# Patient Record
Sex: Male | Born: 1945 | Race: White | Hispanic: No | Marital: Married | Smoking: Never smoker
Health system: Southern US, Community
[De-identification: ages and names within clinical notes are randomized; demographics above are authoritative.]

## PROBLEM LIST (undated history)

## (undated) ENCOUNTER — Emergency Department: Payer: Medicare Other

## (undated) DIAGNOSIS — C801 Malignant (primary) neoplasm, unspecified: Secondary | ICD-10-CM

## (undated) HISTORY — DX: Malignant (primary) neoplasm, unspecified: C80.1

---

## 2013-01-18 DIAGNOSIS — C801 Malignant (primary) neoplasm, unspecified: Secondary | ICD-10-CM

## 2013-01-18 HISTORY — DX: Malignant (primary) neoplasm, unspecified: C80.1

## 2015-09-19 ENCOUNTER — Encounter: Payer: Self-pay | Admitting: *Deleted

## 2015-10-02 ENCOUNTER — Encounter: Payer: Self-pay | Admitting: General Surgery

## 2015-10-06 ENCOUNTER — Encounter: Payer: Self-pay | Admitting: General Surgery

## 2015-10-06 ENCOUNTER — Ambulatory Visit (INDEPENDENT_AMBULATORY_CARE_PROVIDER_SITE_OTHER): Payer: 59 | Admitting: General Surgery

## 2015-10-06 VITALS — BP 124/80 | HR 76 | Resp 12 | Ht 67.0 in | Wt 170.0 lb

## 2015-10-06 DIAGNOSIS — Z1211 Encounter for screening for malignant neoplasm of colon: Secondary | ICD-10-CM | POA: Insufficient documentation

## 2015-10-06 MED ORDER — POLYETHYLENE GLYCOL 3350 17 GM/SCOOP PO POWD: 1.0000 | Freq: Once | ORAL | 0 refills | Status: AC

## 2015-10-06 NOTE — Progress Notes (Signed)
Patient ID: Bruce Estrada., male   DOB: 1945-05-19, 70 y.o.   MRN: [id number]  Chief Complaint  Patient presents with  . Colonoscopy    HPI Bruce Estrada. is a 70 y.o. male here today for a evaluation of a screening colonoscopy.No GI problems at this time.No family history of colon cancer.Moves his bowels two times daily. HPI  Past Medical History:  Diagnosis Date  . 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 No pertinent surgical history.  History reviewed. No pertinent family history.  Social History Social History  Substance Use Topics  . Smoking status: Never Smoker  . Smokeless tobacco: Never Used  . Alcohol use No    No Known Allergies  Current Outpatient Prescriptions  Medication Sig Dispense Refill  . Multiple Vitamins-Minerals (MULTIVITAMIN WITH MINERALS) tablet Take 1 tablet by mouth daily.    . polyethylene glycol powder (GLYCOLAX/MIRALAX) powder Take 255 g by mouth once. 255 g 0   No current facility-administered medications for this visit.     Review of Systems Review of Systems  Constitutional: Negative.   Respiratory: Negative.   Cardiovascular: Negative.     Blood pressure 124/80, pulse 76, resp. rate 12, height 5\' 7"  (1.702 m), weight 170 lb (77.1 kg).  Physical Exam Physical Exam  Constitutional: He is oriented to person, place, and time. He appears well-developed and well-nourished.  Eyes: Conjunctivae are normal. No scleral icterus.  Neck: Neck supple.  Cardiovascular: Normal rate, regular rhythm and normal heart sounds.   Pulmonary/Chest: Effort normal and breath sounds normal.  Abdominal: Soft. Normal appearance and bowel sounds are normal. There is no tenderness.  Lymphadenopathy:    He has no cervical adenopathy.  Neurological: He is alert and oriented to person, place, and time.  Skin: Skin is warm and dry.      Assessment    Candidate for screening colonoscopy    Plan        Colonoscopy with possible  biopsy/polypectomy prn: Information regarding the procedure, including its potential risks and complications (including but not limited to perforation of the bowel, which may require emergency surgery to repair, and bleeding) was verbally given to the patient. Educational information regarding lower intestinal endoscopy was given to the patient. Written instructions for how to complete the bowel prep using Miralax were provided. The importance of drinking ample fluids to avoid dehydration as a result of the prep emphasized.  The patient is scheduled for a Colonoscopy at [facility] on 10/21/15. They are aware to call the day before to get their arrival time. Miralax prescription has been sent into the patient's pharmacy. The patient is aware of date and instructions.    This information has been scribed by Gaspar Cola CMA.   Robert Bellow 10/06/2015, 9:00 PM

## 2015-10-06 NOTE — Patient Instructions (Addendum)
Colonoscopy A colonoscopy is an exam to look at the entire large intestine (colon). This exam can help find problems such as tumors, polyps, inflammation, and areas of bleeding. The exam takes about 1 hour.  LET [facility] CARE PROVIDER KNOW ABOUT:   Any allergies you have.  All medicines you are taking, including vitamins, herbs, eye drops, creams, and over-the-counter medicines.  Previous problems you or members of your family have had with the use of anesthetics.  Any blood disorders you have.  Previous surgeries you have had.  Medical conditions you have. RISKS AND COMPLICATIONS  Generally, this is a safe procedure. However, as with any procedure, complications can occur. Possible complications include:  Bleeding.  Tearing or rupture of the colon wall.  Reaction to medicines given during the exam.  Infection (rare). BEFORE THE PROCEDURE   Ask your health care provider about changing or stopping your regular medicines.  You may be prescribed an oral bowel prep. This involves drinking a large amount of medicated liquid, starting the day before your procedure. The liquid will cause you to have multiple loose stools until your stool is almost clear or light green. This cleans out your colon in preparation for the procedure.  Do not eat or drink anything else once you have started the bowel prep, unless your health care provider tells you it is safe to do so.  Arrange for someone to drive you home after the procedure. PROCEDURE   You will be given medicine to help you relax (sedative).  You will lie on your side with your knees bent.  A long, flexible tube with a light and camera on the end (colonoscope) will be inserted through the rectum and into the colon. The camera sends video back to a computer screen as it moves through the colon. The colonoscope also releases carbon dioxide gas to inflate the colon. This helps your health care provider see the area better.  During  the exam, your health care provider may take a small tissue sample (biopsy) to be examined under a microscope if any abnormalities are found.  The exam is finished when the entire colon has been viewed. AFTER THE PROCEDURE   Do not drive for 24 hours after the exam.  You may have a small amount of blood in your stool.  You may pass moderate amounts of gas and have mild abdominal cramping or bloating. This is caused by the gas used to inflate your colon during the exam.  Ask when your test results will be ready and how you will get your results. Make sure you get your test results.   This information is not intended to replace advice given to you by your health care provider. Make sure you discuss any questions you have with your health care provider.   Document Released: 01/02/2000 Document Revised: 10/25/2012 Document Reviewed: 09/11/2012 Elsevier Interactive Patient Education [employer].  The patient is scheduled for a Colonoscopy at [facility] on 10/21/15. They are aware to call the day before to get their arrival time. Miralax prescription has been sent into the patient's pharmacy. The patient is aware of date and instructions.

## 2015-10-21 ENCOUNTER — Ambulatory Visit: Payer: Medicare Other | Admitting: Anesthesiology

## 2015-10-21 ENCOUNTER — Encounter: Payer: Self-pay | Admitting: *Deleted

## 2015-10-21 ENCOUNTER — Ambulatory Visit
Admission: RE | Admit: 2015-10-21 | Discharge: 2015-10-21 | Disposition: A | Discharge status: Home / Self Care | Payer: Medicare Other | Source: Ambulatory Visit | Attending: General Surgery | Admitting: General Surgery

## 2015-10-21 ENCOUNTER — Encounter
Admission: RE | Disposition: A | Discharge status: Home / Self Care | Payer: Self-pay | Source: Ambulatory Visit | Attending: General Surgery

## 2015-10-21 DIAGNOSIS — D125 Benign neoplasm of sigmoid colon: Secondary | ICD-10-CM | POA: Insufficient documentation

## 2015-10-21 DIAGNOSIS — Z1211 Encounter for screening for malignant neoplasm of colon: Secondary | ICD-10-CM | POA: Insufficient documentation

## 2015-10-21 DIAGNOSIS — Z8546 Personal history of malignant neoplasm of prostate: Secondary | ICD-10-CM | POA: Insufficient documentation

## 2015-10-21 DIAGNOSIS — D122 Benign neoplasm of ascending colon: Secondary | ICD-10-CM | POA: Insufficient documentation

## 2015-10-21 HISTORY — PX: COLONOSCOPY WITH PROPOFOL: SHX5780

## 2015-10-21 SURGERY — COLONOSCOPY WITH PROPOFOL
Anesthesia: General

## 2015-10-21 MED ORDER — FENTANYL CITRATE (PF) 100 MCG/2ML IJ SOLN
INTRAMUSCULAR | Status: DC | PRN
  Administered 2015-10-21: 50 ug via INTRAVENOUS

## 2015-10-21 MED ORDER — MIDAZOLAM HCL 2 MG/2ML IJ SOLN
INTRAMUSCULAR | Status: DC | PRN
  Administered 2015-10-21: 1 mg via INTRAVENOUS

## 2015-10-21 MED ORDER — FENTANYL CITRATE (PF) 100 MCG/2ML IJ SOLN: INTRAMUSCULAR | Status: DC | PRN

## 2015-10-21 MED ORDER — PROPOFOL 500 MG/50ML IV EMUL
INTRAVENOUS | Status: DC | PRN
  Administered 2015-10-21: 120 ug/kg/min via INTRAVENOUS

## 2015-10-21 MED ORDER — MIDAZOLAM HCL 2 MG/2ML IJ SOLN: INTRAMUSCULAR | Status: DC | PRN

## 2015-10-21 MED ORDER — SODIUM CHLORIDE 0.9 % IV SOLN
INTRAVENOUS | Status: DC
  Administered 2015-10-21: 13:00:00 via INTRAVENOUS

## 2015-10-21 MED ORDER — PROPOFOL 10 MG/ML IV BOLUS
INTRAVENOUS | Status: DC | PRN
  Administered 2015-10-21: 30 mg via INTRAVENOUS
  Administered 2015-10-21: 20 mg via INTRAVENOUS

## 2015-10-21 MED ORDER — EPHEDRINE SULFATE 50 MG/ML IJ SOLN
INTRAMUSCULAR | Status: DC | PRN
  Administered 2015-10-21: 10 mg via INTRAVENOUS

## 2015-10-21 NOTE — H&P (Signed)
No change in clinical history or exam.     For screening colonoscopy.  

## 2015-10-21 NOTE — Op Note (Signed)
[facility] Gastroenterology Patient Name: Bruce Estrada Procedure Date: 10/21/2015 1:10 PM MRN: [id number] Account #: [id number] Date of Birth: 1945-05-27 Admit Type: Outpatient Age: 70 Room: [facility] ENDO ROOM 4 Gender: Male Note Status: Finalized Procedure:            Colonoscopy Indications:          Screening for colorectal malignant neoplasm Providers:            Robert Bellow, MD Referring MD:         Leona Carry. Hall Busing, MD (Referring MD) Medicines:            Monitored Anesthesia Care Complications:        No immediate complications. Procedure:            Pre-Anesthesia Assessment:                       - Prior to the procedure, a History and Physical was                        performed, and patient medications, allergies and                        sensitivities were reviewed. The patient's tolerance of                        previous anesthesia was reviewed.                       - The risks and benefits of the procedure and the                        sedation options and risks were discussed with the                        patient. All questions were answered and informed                        consent was obtained.                       After obtaining informed consent, the colonoscope was                        passed under direct vision. Throughout the procedure,                        the patient's blood pressure, pulse, and oxygen                        saturations were monitored continuously. The                        Colonoscope was introduced through the anus and                        advanced to the the cecum, identified by appendiceal                        orifice and ileocecal valve. The colonoscopy was  performed without difficulty. The patient tolerated the                        procedure well. The quality of the bowel preparation                        was excellent. Findings:      A 5 mm polyp was found in the  ascending colon. The polyp was sessile.       Biopsies were taken with a cold forceps for histology.      A 10 mm polyp was found in the sigmoid colon. The polyp was sessile. The       polyp was removed with a cold snare. Resection and retrieval were       complete.      The retroflexed view of the distal rectum and anal verge was normal and       showed no anal or rectal abnormalities. Impression:           - One 5 mm polyp in the ascending colon. Biopsied.                       - One 10 mm polyp in the sigmoid colon, removed with a                        cold snare. Resected and retrieved.                       - The distal rectum and anal verge are normal on                        retroflexion view. Recommendation:       - Telephone endoscopist for pathology results in 1 week. Procedure Code(s):    --- Professional ---                       [phone], Colonoscopy, flexible; with removal of tumor(s),                        polyp(s), or other lesion(s) by snare technique                       45380, 57, Colonoscopy, flexible; with biopsy, single                        or multiple Diagnosis Code(s):    --- Professional ---                       Z12.11, Encounter for screening for malignant neoplasm                        of colon                       D12.5, Benign neoplasm of sigmoid colon                       D12.2, Benign neoplasm of ascending colon CPT copyright 2016 American Medical Association. All rights reserved. The codes documented in this report are preliminary and upon coder review may  be revised to meet current compliance requirements. [name].  Bary Castilla, MD 10/21/2015 1:43:15 PM This report has been signed electronically. Number of Addenda: 0 Note Initiated On: 10/21/2015 1:10 PM Scope Withdrawal Time: 0 hours 16 minutes 14 seconds  Total Procedure Duration: 0 hours 24 minutes 10 seconds       [facility]

## 2015-10-21 NOTE — Anesthesia Preprocedure Evaluation (Signed)
Anesthesia Evaluation  Patient identified by MRN, date of birth, ID band Patient awake    Reviewed: Allergy & Precautions, NPO status , Patient's Chart, lab work & pertinent test results  Airway Mallampati: II       Dental  (+) Teeth Intact   Pulmonary neg pulmonary ROS,    breath sounds clear to auscultation       Cardiovascular negative cardio ROS   Rhythm:Regular     Neuro/Psych negative neurological ROS     GI/Hepatic negative GI ROS, Neg liver ROS,   Endo/Other  negative endocrine ROS  Renal/GU negative Renal ROS     Musculoskeletal negative musculoskeletal ROS (+)   Abdominal Normal abdominal exam  (+)   Peds negative pediatric ROS (+)  Hematology negative hematology ROS (+)   Anesthesia Other Findings   Reproductive/Obstetrics                             Anesthesia Physical Anesthesia Plan  ASA: I  Anesthesia Plan: General   Post-op Pain Management:    Induction: Intravenous  Airway Management Planned: Natural Airway and Nasal Cannula  Additional Equipment:   Intra-op Plan:   Post-operative Plan:   Informed Consent: I have reviewed the patients History and Physical, chart, labs and discussed the procedure including the risks, benefits and alternatives for the proposed anesthesia with the patient or authorized representative who has indicated his/her understanding and acceptance.     Plan Discussed with:   Anesthesia Plan Comments:         Anesthesia Quick Evaluation

## 2015-10-21 NOTE — Transfer of Care (Signed)
Immediate Anesthesia Transfer of Care Note  Patient: Bruce Estrada.  Procedure(s) Performed: Procedure(s): COLONOSCOPY WITH PROPOFOL (N/A)  Patient Location: PACU  Anesthesia Type:General  Level of Consciousness: awake and alert   Airway & Oxygen Therapy: Patient Spontanous Breathing and Patient connected to nasal cannula oxygen  Post-op Assessment: Report given to RN and Post -op Vital signs reviewed and stable  Post vital signs: Reviewed  Last Vitals:  Vitals:   10/21/15 1234  BP: 126/67  Pulse: (!) 56  Resp: 18  Temp: (!) 35.8 C    Last Pain:  Vitals:   10/21/15 1234  TempSrc: Tympanic         Complications: No apparent anesthesia complications

## 2015-10-21 NOTE — Anesthesia Postprocedure Evaluation (Signed)
Anesthesia Post Note  Patient: Bruce Estrada.  Procedure(s) Performed: Procedure(s) (LRB): COLONOSCOPY WITH PROPOFOL (N/A)  Patient location during evaluation: PACU Anesthesia Type: General Level of consciousness: awake Pain management: pain level controlled Vital Signs Assessment: post-procedure vital signs reviewed and stable Respiratory status: spontaneous breathing Cardiovascular status: stable Anesthetic complications: no    Last Vitals:  Vitals:   10/21/15 1404 10/21/15 1414  BP: (!) 114/56 138/64  Pulse:    Resp: (!) 60 16  Temp:      Last Pain:  Vitals:   10/21/15 1344  TempSrc: Tympanic                 VAN STAVEREN,Keyon Winnick

## 2015-10-22 ENCOUNTER — Encounter: Payer: Self-pay | Admitting: General Surgery

## 2015-10-22 ENCOUNTER — Telehealth: Payer: Self-pay

## 2015-10-22 LAB — SURGICAL PATHOLOGY

## 2015-10-22 NOTE — Telephone Encounter (Signed)
-----   Message from Robert Bellow, MD sent at 10/22/2015  3:18 PM EDT ----- Please notify pathology fine, but he needs to have a repeat exam in five years. Thanks.  ----- Message ----- From: Interface, Lab In Three Zero One Sent: 10/22/2015   2:43 PM To: Robert Bellow, MD

## 2015-10-22 NOTE — Telephone Encounter (Signed)
Notified patient as instructed, patient pleased. Discussed follow-up appointments, patient agrees. Patient placed in recalls.   

## 2015-10-22 NOTE — Telephone Encounter (Signed)
Message left for patient to call back for results.

## 2016-01-18 ENCOUNTER — Emergency Department
Admission: EM | Admit: 2016-01-18 | Discharge: 2016-01-18 | Disposition: A | Discharge status: Home / Self Care | Payer: 59 | Attending: Emergency Medicine | Admitting: Emergency Medicine

## 2016-01-18 ENCOUNTER — Encounter: Payer: Self-pay | Admitting: Emergency Medicine

## 2016-01-18 DIAGNOSIS — L239 Allergic contact dermatitis, unspecified cause: Secondary | ICD-10-CM | POA: Diagnosis not present

## 2016-01-18 DIAGNOSIS — Z8546 Personal history of malignant neoplasm of prostate: Secondary | ICD-10-CM | POA: Insufficient documentation

## 2016-01-18 DIAGNOSIS — R21 Rash and other nonspecific skin eruption: Secondary | ICD-10-CM | POA: Diagnosis present

## 2016-01-18 DIAGNOSIS — Z79899 Other long term (current) drug therapy: Secondary | ICD-10-CM | POA: Insufficient documentation

## 2016-01-18 MED ORDER — DEXAMETHASONE SODIUM PHOSPHATE 10 MG/ML IJ SOLN
10.0000 mg | Freq: Once | INTRAMUSCULAR | Status: AC
  Administered 2016-01-18: 10 mg via INTRAMUSCULAR
  Filled 2016-01-18: qty 1

## 2016-01-18 MED ORDER — PREDNISONE 10 MG PO TABS: ORAL_TABLET | ORAL | 0 refills | Status: DC

## 2016-01-18 NOTE — ED Provider Notes (Signed)
[facility] Emergency Department Provider Note  ____________________________________________   First MD Initiated Contact with Patient 01/18/16 1133     (approximate)  I have reviewed the triage vital signs and the nursing notes.   HISTORY  Chief Complaint Rash   HPI Phillips Grim. is a 70 y.o. male is here with complaint of rash to her upper extremities. The patient states that he began breaking out 2 days ago after working in the yard.  Patient states that despite using over-the-counter cortisone cream and Benadryl he continues to itch. He is unaware of any exposure to pesticides or chemicals. He denies any difficulty with breathing. He denies any previous allergens. He denies any other areas of rash to trunk or lower extremities.   Past Medical History:  Diagnosis Date  . Cancer [facility]) 2015   prostate seed    Patient Active Problem List   Diagnosis Date Noted  . Encounter for screening colonoscopy 10/06/2015    Past Surgical History:  Procedure Laterality Date  . COLONOSCOPY WITH PROPOFOL N/A 10/21/2015   Procedure: COLONOSCOPY WITH PROPOFOL;  Surgeon: Robert Bellow, MD;  Location: [facility] ENDOSCOPY;  Service: Endoscopy;  Laterality: N/A;    Prior to Admission medications   Medication Sig Start Date End Date Taking? Authorizing Provider  Multiple Vitamins-Minerals (MULTIVITAMIN WITH MINERALS) tablet Take 1 tablet by mouth daily.    Historical Provider, MD  predniSONE (DELTASONE) 10 MG tablet Take 6 tablets  today, on day 2 take 5 tablets, day 3 take 4 tablets, day 4 take 3 tablets, day 5 take  2 tablets and 1 tablet the last day 01/18/16   Johnn Hai, PA-C    Allergies Patient has no known allergies.  History reviewed. No pertinent family history.  Social History Social History  Substance Use Topics  . Smoking status: Never Smoker  . Smokeless tobacco: Never Used  . Alcohol use No    Review of Systems Constitutional: No  fever/chills ENT: No sore throat. Cardiovascular: Denies chest pain. Respiratory: Denies shortness of breath. Gastrointestinal: No abdominal pain.  No nausea, no vomiting.  Musculoskeletal: Negative for back pain. Skin: Positive for rash. Neurological: Negative for headaches, focal weakness or numbness.  10-point ROS otherwise negative.  ____________________________________________   PHYSICAL EXAM:  VITAL SIGNS: ED Triage Vitals [01/18/16 1112]  Enc Vitals Group     BP      Pulse      Resp      Temp      Temp src      SpO2      Weight      Height      Head Circumference      Peak Flow      Pain Score 5     Pain Loc      Pain Edu?      Excl. in [place]?     Constitutional: Alert and oriented. Well appearing and in no acute distress. Eyes: Conjunctivae are normal. PERRL. EOMI. Head: Atraumatic. Nose: No congestion/rhinnorhea. Neck: No stridor.   Cardiovascular: Normal rate, regular rhythm. Grossly normal heart sounds.  Good peripheral circulation. Respiratory: Normal respiratory effort.  No retractions. Lungs CTAB. Musculoskeletal: Moves upper and lower extremities without any difficulty. Normal gait was noted. Neurologic:  Normal speech and language. No gross focal neurologic deficits are appreciated. No gait instability. Skin:  Skin is warm, dry and intact. Bilateral forearms there is an erythematous rash present. Area is macular without drainage. No vesicles  are seen.  Psychiatric: Mood and affect are normal. Speech and behavior are normal.  ____________________________________________   LABS (all labs ordered are listed, but only abnormal results are displayed)  Labs Reviewed - No data to display  PROCEDURES  Procedure(s) performed: None  Procedures  Critical Care performed: No  ____________________________________________   INITIAL IMPRESSION / ASSESSMENT AND PLAN / ED COURSE  Pertinent labs & imaging results that were available during my care of the  patient were reviewed by me and considered in my medical decision making (see chart for details).    Clinical Course    Patient was given Decadron IM while in the emergency room. He is to continue Benadryl as needed for itching along with his over-the-counter cortisone cream. He was given a prescription for prednisone 60 mg 6 day taper. He is to follow-up with his primary care doctor before he was given a list of 2 doctors who have dermatology practices in [place] that he can follow-up with.  ____________________________________________   FINAL CLINICAL IMPRESSION(S) / ED DIAGNOSES  Final diagnoses:  Allergic contact dermatitis, unspecified trigger      NEW MEDICATIONS STARTED DURING THIS VISIT:  Discharge Medication List as of 01/18/2016 12:54 PM    START taking these medications   Details  predniSONE (DELTASONE) 10 MG tablet Take 6 tablets  today, on day 2 take 5 tablets, day 3 take 4 tablets, day 4 take 3 tablets, day 5 take  2 tablets and 1 tablet the last day, Print         Note:  This document was prepared using Dragon voice recognition software and may include unintentional dictation errors.    Johnn Hai, PA-C 01/18/16 1537    Lavonia Drafts, MD 01/18/16 [id number]

## 2016-01-18 NOTE — ED Triage Notes (Signed)
Pt reports rash to bilaterally lower arms since Friday. OTC products not working

## 2016-01-18 NOTE — ED Notes (Signed)
NAD noted at time of D/C. Pt denies questions or concerns. Pt ambulatory to the lobby at this time.  

## 2016-01-18 NOTE — Discharge Instructions (Signed)
Begin taking prednisone as directed. Benadryl as needed for itching and may continue using over-the-counter cortisone cream directly to your rash. Follow-up with your primary care doctor if any continued problems. You may also follow-up with [facility] or Dr.Dasher if any continued problems with your rash.

## 2016-01-18 NOTE — ED Notes (Signed)
FIRST NURSE NOTE: Pt reports rash to bilateral arms since Friday, using OTC prep, no relief.

## 2017-01-31 ENCOUNTER — Ambulatory Visit (INDEPENDENT_AMBULATORY_CARE_PROVIDER_SITE_OTHER): Payer: No Typology Code available for payment source | Admitting: Podiatry

## 2017-01-31 ENCOUNTER — Ambulatory Visit (INDEPENDENT_AMBULATORY_CARE_PROVIDER_SITE_OTHER): Payer: No Typology Code available for payment source

## 2017-01-31 ENCOUNTER — Encounter: Payer: Self-pay | Admitting: Podiatry

## 2017-01-31 DIAGNOSIS — M722 Plantar fascial fibromatosis: Secondary | ICD-10-CM

## 2017-01-31 MED ORDER — METHYLPREDNISOLONE 4 MG PO TBPK: ORAL_TABLET | ORAL | 0 refills | Status: DC

## 2017-01-31 MED ORDER — MELOXICAM 15 MG PO TABS: 15.0000 mg | ORAL_TABLET | Freq: Every day | ORAL | 3 refills | Status: DC

## 2017-01-31 NOTE — Patient Instructions (Signed)

## 2017-01-31 NOTE — Progress Notes (Signed)
  Subjective:  Patient ID: Bruce Loll., male    DOB: 01/17/1946,  MRN: [id number] HPI Chief Complaint  Patient presents with  . Foot Pain    Plantar heel left - aching x few months, AM pain and swelling, tried changing inserts in shoes and Aleve-no help  . Nail Problem    Hallux nail right - lateral border, discolored and thick, sometimes tender    72 y.o. male presents with the above complaint.     Past Medical History:  Diagnosis Date  . 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 COLONOSCOPY WITH PROPOFOL N/A 10/21/2015   Procedure: COLONOSCOPY WITH PROPOFOL;  Surgeon: Robert Bellow, MD;  Location: [facility] ENDOSCOPY;  Service: Endoscopy;  Laterality: N/A;    Current Outpatient Medications:  .  BOOSTRIX 5-2.5-18.5 LF-MCG/0.5 injection, TO BE ADMINISTERED BY PHARMACIST FOR IMMUNIZATION, Disp: , Rfl: 0 .  Multiple Vitamins-Minerals (MULTIVITAMIN WITH MINERALS) tablet, Take 1 tablet by mouth daily., Disp: , Rfl:   No Known Allergies Review of Systems  All other systems reviewed and are negative.  Objective:  There were no vitals filed for this visit.  General: Well developed, nourished, in no acute distress, alert and oriented x3   Dermatological: Skin is warm, dry and supple bilateral. Nails x 10 are well maintained; remaining integument appears unremarkable at this time. There are no open sores, no preulcerative lesions, no rash or signs of infection present.  Nail dystrophy hallux right.  This appears to be secondary to trauma.   Vascular: Dorsalis Pedis artery and Posterior Tibial artery pedal pulses are 2/4 bilateral with immedate capillary fill time. Pedal hair growth present. No varicosities and no lower extremity edema present bilateral.   Neruologic: Grossly intact via light touch bilateral. Vibratory intact via tuning fork bilateral. Protective threshold with Semmes Wienstein monofilament intact to all pedal  sites bilateral. Patellar and Achilles deep tendon reflexes 2+ bilateral. No Babinski or clonus noted bilateral.   Musculoskeletal: No gross boney pedal deformities bilateral. No pain, crepitus, or limitation noted with foot and ankle range of motion bilateral. Muscular strength 5/5 in all groups tested bilateral.  Pain on palpation medial calcaneal tubercle of the left heel.  No trauma.  Gait: Unassisted, Nonantalgic.    Radiographs:  3 views taken today demonstrate tissue increase in density of plantar fascial calcaneal insertion site of the left heel.  No acute findings.  Assessment & Plan:   Assessment: Plantar fasciitis left.  No dystrophy hallux right.  Plan: Discussed etiology pathology conservative versus surgical therapies.  At this point I injected his left heel today with Kenalog and local anesthetic after sterile Betadine skin prep and verbal confirmation.  We injected with 20 mg of Kenalog 5 mg Marcaine to the point of maximal tenderness medial aspect of the heel he tolerated procedure well.  Also discussed appropriate shoe gear stretching exercises ice therapy as your modifications.  Him on a Medrol Dosepak to be followed by meloxicam.  Also provided him with a plantar fascial brace.  I will follow-up with him in 1 month.     Max [place], [place]

## 2017-03-07 ENCOUNTER — Ambulatory Visit: Payer: No Typology Code available for payment source | Admitting: Podiatry

## 2017-03-07 ENCOUNTER — Encounter: Payer: Self-pay | Admitting: Podiatry

## 2017-03-07 DIAGNOSIS — M722 Plantar fascial fibromatosis: Secondary | ICD-10-CM | POA: Diagnosis not present

## 2017-03-07 NOTE — Progress Notes (Signed)
He presents today for follow-up of his plantar fasciitis to his left foot.  States that he continues his anti-inflammatories about 3 times a week and has 100% relief from his pain on his left foot.  Continues to wear his plantar fascial brace.  Objective: Vital signs are stable alert and oriented x3 there is no erythema edema cellulitis drainage or odor.  Pulses are palpable no calf pain.  He has no pain on palpation medial calcaneal tubercle of the left heel.  Assessment: 100% resolution of plantar fasciitis left.  Plan: I evaluated 2 different sets of tennis shoes for him today they both think that they would work very well.  I think that he should continue his plantar fascial brace and medication as needed.  He will follow-up with me on an as-needed basis.

## 2017-05-31 ENCOUNTER — Other Ambulatory Visit: Payer: Self-pay | Admitting: Podiatry

## 2017-09-05 ENCOUNTER — Other Ambulatory Visit
Admission: RE | Admit: 2017-09-05 | Discharge: 2017-09-05 | Disposition: A | Discharge status: Home / Self Care | Payer: No Typology Code available for payment source | Source: Ambulatory Visit | Attending: Urology | Admitting: Urology

## 2017-09-05 DIAGNOSIS — C61 Malignant neoplasm of prostate: Secondary | ICD-10-CM | POA: Insufficient documentation

## 2017-09-05 DIAGNOSIS — R3915 Urgency of urination: Secondary | ICD-10-CM | POA: Insufficient documentation

## 2017-09-05 DIAGNOSIS — D400 Neoplasm of uncertain behavior of prostate: Secondary | ICD-10-CM | POA: Diagnosis present

## 2017-09-05 LAB — PSA: Prostatic Specific Antigen: 0.37 ng/mL (ref 0.00–4.00)

## 2017-10-19 ENCOUNTER — Ambulatory Visit: Payer: No Typology Code available for payment source | Admitting: Podiatry

## 2017-10-19 ENCOUNTER — Encounter: Payer: Self-pay | Admitting: Podiatry

## 2017-10-19 DIAGNOSIS — M722 Plantar fascial fibromatosis: Secondary | ICD-10-CM | POA: Diagnosis not present

## 2017-10-19 NOTE — Progress Notes (Signed)
He presents today for follow-up of pain to his left heel.  States that it started hurting about a month ago after he noticed that his shoes had started to wear a little bit.  Objective: Vital signs are stable alert and oriented x3.  Pulses are palpable.  He has pain on palpation medial calcaneal tubercle of the left heel.  Her graph assessment: Plantar fasciitis left.  Plan: Injected 20 mg Kenalog 5 mg Marcaine point maximal tenderness left heel.  Dispensed another plantar fascial brace discussed appropriate shoe gear stretching exercise ice therapy sugar modifications may need to consider orthotics we will follow-up with him in the near future.

## 2019-07-10 DIAGNOSIS — Z85828 Personal history of other malignant neoplasm of skin: Secondary | ICD-10-CM | POA: Insufficient documentation

## 2019-09-12 ENCOUNTER — Other Ambulatory Visit (HOSPITAL_COMMUNITY): Payer: Self-pay | Admitting: Urology

## 2019-09-12 ENCOUNTER — Other Ambulatory Visit: Payer: Self-pay | Admitting: Urology

## 2019-09-12 DIAGNOSIS — R972 Elevated prostate specific antigen [PSA]: Secondary | ICD-10-CM

## 2019-09-29 ENCOUNTER — Ambulatory Visit: Payer: No Typology Code available for payment source

## 2019-10-05 ENCOUNTER — Ambulatory Visit
Admission: RE | Admit: 2019-10-05 | Discharge: 2019-10-05 | Disposition: A | Discharge status: Home / Self Care | Payer: No Typology Code available for payment source | Source: Ambulatory Visit | Attending: Urology | Admitting: Urology

## 2019-10-05 ENCOUNTER — Other Ambulatory Visit: Payer: Self-pay

## 2019-10-05 DIAGNOSIS — R972 Elevated prostate specific antigen [PSA]: Secondary | ICD-10-CM | POA: Diagnosis not present

## 2019-10-05 MED ORDER — GADOBUTROL 1 MMOL/ML IV SOLN
8.0000 mL | Freq: Once | INTRAVENOUS | Status: AC | PRN
  Administered 2019-10-05: 8 mL via INTRAVENOUS

## 2019-11-12 ENCOUNTER — Other Ambulatory Visit: Payer: Self-pay | Admitting: Urology

## 2019-11-12 ENCOUNTER — Other Ambulatory Visit (HOSPITAL_COMMUNITY): Payer: Self-pay | Admitting: Urology

## 2019-11-12 DIAGNOSIS — C61 Malignant neoplasm of prostate: Secondary | ICD-10-CM

## 2019-11-12 DIAGNOSIS — R972 Elevated prostate specific antigen [PSA]: Secondary | ICD-10-CM

## 2019-11-28 ENCOUNTER — Ambulatory Visit
Admission: RE | Admit: 2019-11-28 | Discharge: 2019-11-28 | Disposition: A | Discharge status: Home / Self Care | Payer: No Typology Code available for payment source | Source: Ambulatory Visit | Attending: Urology | Admitting: Urology

## 2019-11-28 ENCOUNTER — Other Ambulatory Visit: Payer: Self-pay

## 2019-11-28 ENCOUNTER — Other Ambulatory Visit: Payer: No Typology Code available for payment source

## 2019-11-28 DIAGNOSIS — R972 Elevated prostate specific antigen [PSA]: Secondary | ICD-10-CM | POA: Insufficient documentation

## 2019-11-28 DIAGNOSIS — C61 Malignant neoplasm of prostate: Secondary | ICD-10-CM | POA: Diagnosis present

## 2019-11-28 MED ORDER — TECHNETIUM TC 99M MEDRONATE IV KIT
20.8230 | PACK | Freq: Once | INTRAVENOUS | Status: AC | PRN
  Administered 2019-11-28: 20.823 via INTRAVENOUS

## 2019-12-07 ENCOUNTER — Other Ambulatory Visit: Payer: Self-pay | Admitting: Urology

## 2019-12-07 DIAGNOSIS — C61 Malignant neoplasm of prostate: Secondary | ICD-10-CM

## 2019-12-11 ENCOUNTER — Encounter: Admission: RE | Admit: 2019-12-11 | Payer: No Typology Code available for payment source | Source: Ambulatory Visit

## 2019-12-12 ENCOUNTER — Encounter
Admission: RE | Admit: 2019-12-12 | Discharge: 2019-12-12 | Disposition: A | Discharge status: Home / Self Care | Payer: No Typology Code available for payment source | Source: Ambulatory Visit | Attending: Urology | Admitting: Urology

## 2019-12-12 ENCOUNTER — Other Ambulatory Visit: Payer: Self-pay

## 2019-12-12 DIAGNOSIS — C61 Malignant neoplasm of prostate: Secondary | ICD-10-CM | POA: Insufficient documentation

## 2019-12-12 MED ORDER — PIFLIFOLASTAT F 18 (PYLARIFY) INJECTION
9.0000 | Freq: Once | INTRAVENOUS | Status: AC
  Administered 2019-12-12: 9.788 via INTRAVENOUS

## 2020-10-07 ENCOUNTER — Telehealth: Payer: No Typology Code available for payment source

## 2020-10-07 NOTE — Telephone Encounter (Signed)
Pt is due for his colonoscopy. Attempted to call pt but his VM is full. Unable to LM.

## 2020-10-08 ENCOUNTER — Telehealth: Payer: Self-pay | Admitting: Surgery

## 2020-10-08 NOTE — Telephone Encounter (Signed)
Patient calls back,  he is informed that his colonoscopy will be due soon.  He will be calling [facility] to arrange with Dr. Bary Castilla, patient given number to call their office.

## 2020-10-24 ENCOUNTER — Other Ambulatory Visit: Payer: Self-pay | Admitting: General Surgery

## 2020-10-24 NOTE — Progress Notes (Signed)
Subjective:     Patient ID: Bruce Estrada is a 75 y.o. male.   HPI   The following portions of the patient's history were reviewed and updated as appropriate.   This an established patient is here today for: office visit. He is here to discuss having a colonoscopy, last completed in 2017. He states his bowels move regular and no bleeding.   Review of Systems  Constitutional: Negative for chills and fever.  Respiratory: Negative for cough.   Gastrointestinal: Negative for anal bleeding and blood in stool.         Chief Complaint  Patient presents with   Pre-op Exam      BP (!) 144/76   Pulse 61   Temp 36.4 C (97.6 F)   Ht 170.2 cm (5\' 7" )   Wt 70.8 kg (156 lb)   SpO2 98%   BMI 24.43 kg/m        Past Medical History:  Diagnosis Date   Basal cell carcinoma 07/11/2019   Prostate cancer (CMS-HCC) 2015           Past Surgical History:  Procedure Laterality Date   COLONOSCOPY   10/21/2015    Dr Bary Castilla   excision skin cancer       MOHS SURGERY              Social History          Socioeconomic History   Marital status: Married  Tobacco Use   Smoking status: Never Smoker   Smokeless tobacco: Never Used  Substance and Sexual Activity   Alcohol use: Never   Drug use: Never        No Known Allergies   Current Medications        Current Outpatient Medications  Medication Sig Dispense Refill   ascorbic acid/multivit-min (EMERGEN-C ORAL) Take by mouth once daily       cartilage/collagen/bor/hyalur (JOINT HEALTH ORAL) Take by mouth once daily       ergocalciferol, vitamin D2, (VITAMIN D2 ORAL) Take by mouth once daily       ERLEADA 60 mg tablet once daily       multivitamin with minerals tablet Take 1 tablet by mouth once daily       ORGOVYX 120 mg tablet once daily       HYDROcodone-acetaminophen (NORCO) 5-325 mg tablet Take 1 tablet every 4 to 6 hours if needed for pain. (Patient not taking: Reported on 10/23/2020) 12 tablet 0    No current  facility-administered medications for this visit.             Family History  Problem Relation Age of Onset   High blood pressure (Hypertension) Mother     Diabetes Father     Colon cancer Neg Hx     Breast cancer Neg Hx             Objective:   Physical Exam Constitutional:      Appearance: Normal appearance.  Cardiovascular:     Rate and Rhythm: Normal rate and regular rhythm.     Pulses: Normal pulses.     Heart sounds: Normal heart sounds.     Comments: Frequent premature beats. Pulmonary:     Effort: Pulmonary effort is normal.     Breath sounds: Normal breath sounds.  Musculoskeletal:     Cervical back: Neck supple.  Skin:    General: Skin is warm and dry.  Neurological:     Mental Status: He is  alert and oriented to person, place, and time.  Psychiatric:        Mood and Affect: Mood normal.        Behavior: Behavior normal.      Labs and Radiology:      October 21, 2015 colonoscopy reviewed.  Small polyps in the ascending and sigmoid colon, and 5 and 10 mm respectively.   Pathology:     DIAGNOSIS:  A. COLON POLYP, ASCENDING; COLD BIOPSY:  - TUBULAR ADENOMA.  - NEGATIVE FOR HIGH-GRADE DYSPLASIA AND MALIGNANCY.   B. COLON POLYP, SIGMOID; COLD SNARE:  - TUBULAR ADENOMA.  - NEGATIVE FOR HIGH-GRADE DYSPLASIA AND MALIGNANCY.       Assessment:     Candidate for follow-up colonoscopy based on earlier polyp identification.    Plan:     Indication for colonoscopy were reviewed.  Risks discussed.  Instructed on preparation by the staff.      This note is partially prepared by Karie Fetch, RN, acting as a scribe in the presence of Dr. Hervey Ard, MD.  The documentation recorded by the scribe accurately reflects the service I personally performed and the decisions made by me.    Robert Bellow, MD FACS

## 2020-10-27 DIAGNOSIS — I517 Cardiomegaly: Secondary | ICD-10-CM | POA: Insufficient documentation

## 2020-10-27 DIAGNOSIS — I700 Atherosclerosis of aorta: Secondary | ICD-10-CM | POA: Insufficient documentation

## 2020-10-27 DIAGNOSIS — I480 Paroxysmal atrial fibrillation: Secondary | ICD-10-CM | POA: Insufficient documentation

## 2020-11-18 ENCOUNTER — Encounter: Payer: Self-pay | Admitting: General Surgery

## 2020-11-19 ENCOUNTER — Encounter
Admission: RE | Disposition: A | Discharge status: Home / Self Care | Payer: Self-pay | Source: Home / Self Care | Attending: General Surgery

## 2020-11-19 ENCOUNTER — Ambulatory Visit: Payer: No Typology Code available for payment source | Admitting: Anesthesiology

## 2020-11-19 ENCOUNTER — Ambulatory Visit
Admission: RE | Admit: 2020-11-19 | Discharge: 2020-11-19 | Disposition: A | Discharge status: Home / Self Care | Payer: No Typology Code available for payment source | Attending: General Surgery | Admitting: General Surgery

## 2020-11-19 ENCOUNTER — Encounter: Payer: Self-pay | Admitting: General Surgery

## 2020-11-19 DIAGNOSIS — Z7901 Long term (current) use of anticoagulants: Secondary | ICD-10-CM | POA: Insufficient documentation

## 2020-11-19 DIAGNOSIS — D128 Benign neoplasm of rectum: Secondary | ICD-10-CM | POA: Insufficient documentation

## 2020-11-19 DIAGNOSIS — Z1211 Encounter for screening for malignant neoplasm of colon: Secondary | ICD-10-CM | POA: Diagnosis present

## 2020-11-19 DIAGNOSIS — D123 Benign neoplasm of transverse colon: Secondary | ICD-10-CM | POA: Insufficient documentation

## 2020-11-19 HISTORY — PX: COLONOSCOPY WITH PROPOFOL: SHX5780

## 2020-11-19 SURGERY — COLONOSCOPY WITH PROPOFOL
Anesthesia: General

## 2020-11-19 MED ORDER — STERILE WATER FOR IRRIGATION IR SOLN
Status: DC | PRN
  Administered 2020-11-19: 60 mL

## 2020-11-19 MED ORDER — PROPOFOL 500 MG/50ML IV EMUL
INTRAVENOUS | Status: DC | PRN
  Administered 2020-11-19: 150 ug/kg/min via INTRAVENOUS

## 2020-11-19 MED ORDER — SODIUM CHLORIDE 0.9 % IV SOLN
INTRAVENOUS | Status: DC
  Administered 2020-11-19: 1000 mL via INTRAVENOUS

## 2020-11-19 MED ORDER — PROPOFOL 10 MG/ML IV BOLUS
INTRAVENOUS | Status: DC | PRN
  Administered 2020-11-19: 50 mg via INTRAVENOUS

## 2020-11-19 NOTE — Discharge Instructions (Addendum)
Do not start  Eliquis until Friday evening.

## 2020-11-19 NOTE — Anesthesia Preprocedure Evaluation (Signed)
Anesthesia Evaluation  Patient identified by MRN, date of birth, ID band Patient awake    Reviewed: Allergy & Precautions, NPO status , Patient's Chart, lab work & pertinent test results  History of Anesthesia Complications Negative for: history of anesthetic complications  Airway Mallampati: II       Dental  (+) Teeth Intact, Dental Advidsory Given   Pulmonary neg pulmonary ROS,    breath sounds clear to auscultation       Cardiovascular negative cardio ROS   Rhythm:Regular     Neuro/Psych negative neurological ROS     GI/Hepatic negative GI ROS, Neg liver ROS,   Endo/Other  negative endocrine ROS  Renal/GU negative Renal ROS     Musculoskeletal negative musculoskeletal ROS (+)   Abdominal Normal abdominal exam  (+)   Peds negative pediatric ROS (+)  Hematology negative hematology ROS (+)   Anesthesia Other Findings Past Medical History: 2015: Cancer ([place])     Comment:  prostate seed   Reproductive/Obstetrics                             Anesthesia Physical  Anesthesia Plan  ASA: 2  Anesthesia Plan: General   Post-op Pain Management:    Induction: Intravenous  PONV Risk Score and Plan: 2 and TIVA and Propofol infusion  Airway Management Planned: Natural Airway and Nasal Cannula  Additional Equipment:   Intra-op Plan:   Post-operative Plan:   Informed Consent: I have reviewed the patients History and Physical, chart, labs and discussed the procedure including the risks, benefits and alternatives for the proposed anesthesia with the patient or authorized representative who has indicated his/her understanding and acceptance.       Plan Discussed with:   Anesthesia Plan Comments:         Anesthesia Quick Evaluation

## 2020-11-19 NOTE — Transfer of Care (Signed)
Immediate Anesthesia Transfer of Care Note  Patient: [name].  Procedure(s) Performed: COLONOSCOPY WITH PROPOFOL  Patient Location: PACU  Anesthesia Type:General  Level of Consciousness: awake and alert   Airway & Oxygen Therapy: Patient Spontanous Breathing  Post-op Assessment: Report given to RN and Post -op Vital signs reviewed and stable  Post vital signs: Reviewed and stable  Last Vitals:  Vitals Value Taken Time  BP 86/63 11/19/20 1049  Temp    Pulse 77 11/19/20 1049  Resp 15 11/19/20 1049  SpO2 98 % 11/19/20 1049  Vitals shown include unvalidated device data.  Last Pain:  Vitals:   11/19/20 0944  TempSrc: Temporal  PainSc: 0-No pain         Complications: No notable events documented.

## 2020-11-19 NOTE — H&P (Signed)
Bruce Estrada [id number] 03-11-1945     HPI:  75 y/o male with past history of colonic polyps.  For repeat endoscopy. Held Eliquis x 4 doses as requested. Tolerated prep well.   Medications Prior to Admission  Medication Sig Dispense Refill Last Dose   apalutamide (ERLEADA) 60 MG tablet Take 240 mg by mouth daily. May be taken with or without food. Swallow tablets whole.      ascorbic acid (VITAMIN C) 500 MG tablet Take 500 mg by mouth daily.      ergocalciferol (VITAMIN D2) 1.25 MG (50000 UT) capsule Take 50,000 Units by mouth once a week.      HYDROcodone-acetaminophen (NORCO/VICODIN) 5-325 MG tablet Take 1 tablet by mouth every 6 (six) hours as needed for moderate pain.      meloxicam (MOBIC) 15 MG tablet TAKE 1 TABLET BY MOUTH EVERY DAY 30 tablet 3 11/18/2020   Misc Natural Products (JOINT HEALTH) CAPS Take by mouth.      Multiple Vitamins-Minerals (MULTIVITAMIN WITH MINERALS) tablet Take 1 tablet by mouth daily.   11/18/2020   Relugolix (ORGOVYX) 120 MG TABS Take by mouth.      BOOSTRIX 5-2.5-18.5 LF-MCG/0.5 injection TO BE ADMINISTERED BY PHARMACIST FOR IMMUNIZATION  0    No Known Allergies Past Medical History:  Diagnosis Date   Cancer ([place]) 2015   prostate seed   Past Surgical History:  Procedure Laterality Date   COLONOSCOPY WITH PROPOFOL N/A 10/21/2015   Procedure: COLONOSCOPY WITH PROPOFOL;  Surgeon: Robert Bellow, MD;  Location: [facility] ENDOSCOPY;  Service: Endoscopy;  Laterality: N/A;   Social History   Socioeconomic History   Marital status: Married    Spouse name: Not on file   Number of children: Not on file   Years of education: Not on file   Highest education level: Not on file  Occupational History   Not on file  Tobacco Use   Smoking status: Never   Smokeless tobacco: Never  Vaping Use   Vaping Use: Never used  Substance and Sexual Activity   Alcohol use: No   Drug use: No   Sexual activity: Not on file  Other Topics Concern   Not on file   Social History Narrative   Not on file   Social Determinants of Health   Financial Resource Strain: Not on file  Food Insecurity: Not on file  Transportation Needs: Not on file  Physical Activity: Not on file  Stress: Not on file  Social Connections: Not on file  Intimate Partner Violence: Not on file   Social History   Social History Narrative   Not on file     ROS: Negative.   October 21, 2015 colonoscopy reviewed.  Small polyps in the ascending and sigmoid colon, and 5 and 10 mm respectively.   Pathology:     DIAGNOSIS:  A. COLON POLYP, ASCENDING; COLD BIOPSY:  - TUBULAR ADENOMA.  - NEGATIVE FOR HIGH-GRADE DYSPLASIA AND MALIGNANCY.   B. COLON POLYP, SIGMOID; COLD SNARE:  - TUBULAR ADENOMA.  - NEGATIVE FOR HIGH-GRADE DYSPLASIA AND MALIGNANCY.   PE: HEENT: Negative. Lungs: Clear. Cardio: RR.   Assessment/Plan:  Proceed with planned endoscopy.    Bruce Estrada [facility] 11/19/2020

## 2020-11-19 NOTE — Op Note (Signed)
[facility] Gastroenterology Patient Name: Bruce Estrada Procedure Date: 11/19/2020 10:04 AM MRN: [id number] Account #: [id number] Date of Birth: 01-17-1946 Admit Type: Outpatient Age: 75 Room: [facility] ENDO ROOM 1 Gender: Male Note Status: Finalized Instrument Name: Peds Colonoscope [id number] Procedure:             Colonoscopy Indications:           High risk colon cancer surveillance: Personal history                         of colonic polyps Providers:             Robert Bellow, MD Referring MD:          Leona Carry. Hall Busing, MD (Referring MD) Medicines:             Propofol per Anesthesia Complications:         No immediate complications. Procedure:             Pre-Anesthesia Assessment:                        - Prior to the procedure, a History and Physical was                         performed, and patient medications, allergies and                         sensitivities were reviewed. The patient's tolerance                         of previous anesthesia was reviewed.                        - The risks and benefits of the procedure and the                         sedation options and risks were discussed with the                         patient. All questions were answered and informed                         consent was obtained.                        After obtaining informed consent, the colonoscope was                         passed under direct vision. Throughout the procedure,                         the patient's blood pressure, pulse, and oxygen                         saturations were monitored continuously. The                         Colonoscope was introduced through the anus and  advanced to the the cecum, identified by appendiceal                         orifice and ileocecal valve. The colonoscopy was                         performed without difficulty. The patient tolerated                         the procedure well. The  quality of the bowel                         preparation was good. Findings:      Two sessile polyps were found in the rectum and transverse colon. The       polyps were 5 mm in size. These were biopsied with a cold large-capacity       forceps for histology.      The retroflexed view of the distal rectum and anal verge was normal and       showed no anal or rectal abnormalities. Impression:            - Two 5 mm polyps in the rectum and in the transverse                         colon. Biopsied.                        - The distal rectum and anal verge are normal on                         retroflexion view. Recommendation:        - Telephone endoscopist for pathology results in 1                         week. Procedure Code(s):     --- Professional ---                        [phone], Colonoscopy, flexible; with biopsy, single or                         multiple Diagnosis Code(s):     --- Professional ---                        Z86.010, Personal history of colonic polyps                        K62.1, Rectal polyp                        K63.5, Polyp of colon CPT copyright 2019 American Medical Association. All rights reserved. The codes documented in this report are preliminary and upon coder review may  be revised to meet current compliance requirements. Robert Bellow, MD 11/19/2020 10:46:27 AM This report has been signed electronically. Number of Addenda: 0 Note Initiated On: 11/19/2020 10:04 AM Scope Withdrawal Time: 0 hours 17 minutes 48 seconds  Total Procedure Duration: 0 hours 26 minutes 21 seconds  Estimated Blood Loss:  Estimated blood loss was minimal.      [facility]  Center

## 2020-11-19 NOTE — Anesthesia Postprocedure Evaluation (Signed)
Anesthesia Post Note  Patient: Bruce Estrada.  Procedure(s) Performed: COLONOSCOPY WITH PROPOFOL  Patient location during evaluation: Endoscopy Anesthesia Type: General Level of consciousness: awake and alert and oriented Pain management: pain level controlled Vital Signs Assessment: post-procedure vital signs reviewed and stable Respiratory status: spontaneous breathing, nonlabored ventilation and respiratory function stable Cardiovascular status: blood pressure returned to baseline and stable Postop Assessment: no signs of nausea or vomiting Anesthetic complications: no   No notable events documented.   Last Vitals:  Vitals:   11/19/20 0944 11/19/20 1048  BP: 123/72 (!) 86/63  Pulse: (!) 54 96  Resp: 18 10  Temp: (!) 35.7 C (!) 35.6 C  SpO2: 99% 98%    Last Pain:  Vitals:   11/19/20 1048  TempSrc: Temporal  PainSc: Asleep                 Rashanda Magloire

## 2020-11-20 ENCOUNTER — Encounter: Payer: Self-pay | Admitting: General Surgery

## 2020-11-20 LAB — SURGICAL PATHOLOGY

## 2021-12-30 ENCOUNTER — Telehealth: Payer: Self-pay | Admitting: Oncology

## 2021-12-30 NOTE — Telephone Encounter (Signed)
Called patient to schedule the appointment and he refused to schedule once I notified the patient that we would not be able to administer the injections Dr. Yves Dill had given the patient to take home and store since he is retiring. I confirmed with pharmacy that administering the Lupron injection would be a liability. Pt stated he would find a office who would give him the injection.I called Dr. Letta Kocher office and spoke to [facility] to make her aware of the patients refusal.

## 2022-02-11 DIAGNOSIS — C61 Malignant neoplasm of prostate: Secondary | ICD-10-CM | POA: Insufficient documentation

## 2022-08-13 IMAGING — CT NM PET TUM IMG SKULL BASE T - THIGH
1 of 9 series · 1 of 25 positions shown · non-contrast
Comparison: 10/05/2019 prostate [REDACTED] scan 11/29/2019.

CLINICAL DATA: Radiation seed implants 5 years ago. History of skin
cancer. Right arm [id number] vaccine 12/06/2019.

EXAM:
NUCLEAR MEDICINE PET SKULL BASE TO THIGH
TECHNIQUE: V.V33mCi F18 Piflufolastat (Pylarify) was injected intravenously.
Full-ring PET imaging was performed from the skull base to thigh
after the radiotracer. CT data was obtained and used for attenuation
correction and anatomic localization.

[Series 4: ct wb 5.0 b30f · axial · 5.0mm · 0.98mm/px · 1 of 290 slices shown]
[im 290/290  brain]
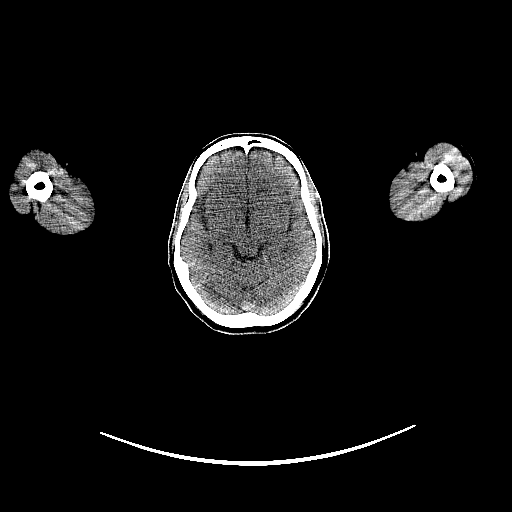

[1 of 25 positions shown; findings below may reference images not displayed]

FINDINGS: NECK

No radiotracer activity in neck lymph nodes.

Incidental CT finding: No cervical adenopathy.

CHEST

No pulmonary parenchymal or thoracic nodal hypermetabolism.

Incidental CT finding: Aortic atherosclerosis. Mild cardiomegaly.
Clear lungs.

ABDOMEN/PELVIS

Prostate: No focal activity in the prostate bed.

Lymph nodes: Abdominopelvic hypermetabolic nodes. Retroaortic index
node measures 7 mm and a S.U.V. max of 5.8 on 172/4.

More caudal retroaortic nodes measure up to 8 mm and a S.U.V. max of
7.5 on 183/4.

Precaval node measures 6 mm and a S.U.V. max of 8.6 on 196/4.

Left obturator node measures 6 mm and a S.U.V. max of 12.4 on 225/4.

Liver: No evidence of liver metastasis

Incidental CT finding: Normal adrenal glands. Abdominal aortic
atherosclerosis. Right abdominal (likely retroperitoneal) 8.2 x
cm fluid density structure is favored to represent a lymphangioma
versus less likely loculated fluid. Example 173/4.

Radiation seeds in the prostate.

SKELETON

Vague hypermetabolism within the fifth lateral left rib measures a
S.U.V. max of 1.9, and is favored to be posttraumatic or
physiologic.

Hypermetabolism and sclerosis involving the left iliac bone,
immediately adjacent the sacroiliac joint measures 1.8 cm and a
S.U.V. max of 2.1 on 205/4. This corresponds to the bone scan
abnormality.

No correlate for the T8 spinous process abnormality on prior bone
scan.
IMPRESSION: 1. Hypermetabolic abdominopelvic nodal metastasis as detailed above.
2. Sclerosis and hypermetabolism within the medial left iliac,
favored to represent a benign bone island versus less likely
isolated osseous metastasis.
3. Right abdominal retroperitoneal fluid density structure is
favored to represent a lymphangioma versus less likely loculated
retroperitoneal fluid.
4.  Aortic Atherosclerosis ([id number]).

## 2023-03-21 ENCOUNTER — Ambulatory Visit: Payer: Medicare HMO | Admitting: Podiatry

## 2023-03-28 ENCOUNTER — Ambulatory Visit: Admitting: Podiatry

## 2023-03-30 ENCOUNTER — Encounter: Payer: Self-pay | Admitting: Podiatry

## 2023-03-30 ENCOUNTER — Ambulatory Visit: Admitting: Podiatry

## 2023-03-30 DIAGNOSIS — Z9181 History of falling: Secondary | ICD-10-CM

## 2023-03-30 DIAGNOSIS — R2681 Unsteadiness on feet: Secondary | ICD-10-CM | POA: Diagnosis not present

## 2023-03-30 NOTE — Progress Notes (Signed)
 Subjective:  Patient ID: Bruce Gwynneth Aliment., male    DOB: February 16, 1945,  MRN: [id number] HPI Chief Complaint  Patient presents with   Ankle Pain    Patient states when he runs with his 2 Huskies he occasionally feels unsteady or weakness in his ankles as it may roll, questions if there is an inserts that would help, also check hallux left - clipped nail too close   New Patient (Initial Visit)    Est pt 2019    78 y.o. male presents with the above complaint.   ROS: Denies fever chills nausea vomit muscle aches pains calf pain back pain chest pain shortness of breath.  Past Medical History:  Diagnosis Date   Cancer [facility]) 2015   prostate seed   Past Surgical History:  Procedure Laterality Date   COLONOSCOPY WITH PROPOFOL N/A 10/21/2015   Procedure: COLONOSCOPY WITH PROPOFOL;  Surgeon: Earline Mayotte, MD;  Location: [facility] ENDOSCOPY;  Service: Endoscopy;  Laterality: N/A;   COLONOSCOPY WITH PROPOFOL N/A 11/19/2020   Procedure: COLONOSCOPY WITH PROPOFOL;  Surgeon: Earline Mayotte, MD;  Location: [facility] ENDOSCOPY;  Service: Endoscopy;  Laterality: N/A;    Current Outpatient Medications:    apixaban (ELIQUIS) 5 MG TABS tablet, Take 1 tablet by mouth 2 (two) times daily., Disp: , Rfl:    Ascorbic Acid (VITAMIN C IMMUNE HEALTH PO), Take by mouth., Disp: , Rfl:    cyclobenzaprine (FLEXERIL) 5 MG tablet, Take 5 mg by mouth 3 (three) times daily as needed., Disp: , Rfl:    donepezil (ARICEPT) 5 MG tablet, Take by mouth., Disp: , Rfl:    Leuprolide Acetate, 6 Month, (LUPRON DEPOT, 75-MONTH, IM), Inject into the muscle., Disp: , Rfl:    metoprolol tartrate (LOPRESSOR) 25 MG tablet, Take 1 tablet by mouth 2 (two) times daily., Disp: , Rfl:    apalutamide (ERLEADA) 60 MG tablet, Take 240 mg by mouth daily. May be taken with or without food. Swallow tablets whole., Disp: , Rfl:    ergocalciferol (VITAMIN D2) 1.25 MG (50000 UT) capsule, Take 50,000 Units by mouth once a week., Disp: , Rfl:    Misc  Natural Products (JOINT HEALTH) CAPS, Take by mouth., Disp: , Rfl:    Multiple Vitamins-Minerals (MULTIVITAMIN WITH MINERALS) tablet, Take 1 tablet by mouth daily., Disp: , Rfl:    NUBEQA 300 MG tablet, Take 600 mg by mouth 2 (two) times daily., Disp: , Rfl:    Relugolix (ORGOVYX) 120 MG TABS, Take by mouth., Disp: , Rfl:   Allergies  Allergen Reactions   Latex     Other Reaction(s): Unknown   Review of Systems Objective:  There were no vitals filed for this visit.  General: Well developed, nourished, in no acute distress, alert and oriented x3   Dermatological: Skin is warm, dry and supple bilateral. Nails x 10 are well maintained; remaining integument appears unremarkable at this time. There are no open sores, no preulcerative lesions, no rash or signs of infection present.  Vascular: Dorsalis Pedis artery and Posterior Tibial artery pedal pulses are 2/4 bilateral with immedate capillary fill time. Pedal hair growth present. No varicosities and no lower extremity edema present bilateral.   Neruologic: Grossly intact via light touch bilateral. Vibratory intact via tuning fork bilateral. Protective threshold with Semmes Wienstein monofilament intact to all pedal sites bilateral. Patellar and Achilles deep tendon reflexes 2+ bilateral. No Babinski or clonus noted bilateral.   Musculoskeletal: No gross boney pedal deformities bilateral. No pain, crepitus, or limitation  noted with foot and ankle range of motion bilateral. Muscular strength 5/5 in all groups tested bilateral.  Gait: Unassisted, Nonantalgic.    Radiographs:  None taken  Assessment & Plan:   Assessment: Subjective imbalance and weakness bilateral lower extremity possibly a fall risk.  Plan: Secondary to history of instability gait and balance I am requesting gait training and balance training to help prevent fall risk for this 78 year old male.  We will refer him to [facility] on  [address].     Onofrio Klemp T. [place], [place]

## 2023-04-04 ENCOUNTER — Ambulatory Visit: Admitting: Physical Therapy

## 2023-04-04 NOTE — Therapy (Deleted)
 OUTPATIENT PHYSICAL THERAPY EVALUATION   Patient Name: Bruce Estrada. 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   Past Surgical History:  Procedure Laterality Date   COLONOSCOPY WITH PROPOFOL N/A 10/21/2015   Procedure: COLONOSCOPY WITH PROPOFOL;  Surgeon: Earline Mayotte, MD;  Location: [facility];  Service: Endoscopy;  Laterality: N/A;   COLONOSCOPY WITH PROPOFOL N/A 11/19/2020   Procedure: COLONOSCOPY WITH PROPOFOL;  Surgeon: Earline Mayotte, MD;  Location: [facility];  Service: Endoscopy;  Laterality: N/A;   Patient Active Problem List   Diagnosis Date Noted   Malignant neoplasm of prostate (HCC) 02/11/2022   Atherosclerosis of abdominal aorta (HCC) 10/27/2020   Enlarged heart 10/27/2020   Paroxysmal A-fib (HCC) 10/27/2020   Personal history of other malignant neoplasm of skin 07/10/2019   Encounter for screening colonoscopy 10/06/2015    PCP: Katherina Right C. Arlana Pouch, MD  REFERRING PROVIDER: Elinor Parkinson, DPM  REFERRING DIAG: unsteady gait when walking, at moderate risk for fall  Rationale for Evaluation and Treatment: Rehabilitation  THERAPY DIAG:  No diagnosis found.  ONSET DATE: ***  SUBJECTIVE:                                                                                                                                                                                           SUBJECTIVE STATEMENT: ***  PERTINENT HISTORY:  Patient is a 78 y.o. male who presents to outpatient physical therapy with a referral for medical diagnosis unsteady gait when walking, at moderate risk for fall. This patient's chief complaints consist of ***, leading to the following functional deficits: ***. Relevant past medical history and comorbidities include has Atherosclerosis of abdominal aorta (HCC); Enlarged heart; Malignant neoplasm of prostate (HCC);  Paroxysmal A-fib (HCC); and Personal history of other malignant neoplasm of skin. Patient denies hx of {redflags:27294}  From DPM note 03/30/2023:  Ankle Pain  Patient states when he runs with his 2 Huskies he occasionally feels unsteady or weakness in his ankles as it may roll, questions if there is an inserts that would help, also check hallux left - clipped nail too close   PAIN:  Are you having pain? {OPRCPAIN:27236}  PRECAUTIONS: {Therapy precautions:24002}  RED FLAGS: {PT Red Flags:29287}   WEIGHT BEARING RESTRICTIONS: {Yes ***/No:24003}  FALLS:  Has patient fallen in last 6 months? {fallsyesno:27318}  LIVING ENVIRONMENT: Lives with: {OPRC lives with:25569::"lives with their family"} Lives in: {Lives in:25570} Stairs: {opstairs:27293} Has following equipment at home: {Assistive devices:23999}  OCCUPATION: ***  PLOF: {PLOF:24004}  PATIENT GOALS: ***  NEXT MD VISIT: ***  OBJECTIVE  DIAGNOSTIC FINDINGS:  ***  SELF-REPORTED FUNCTION {PROS:32229}  OBSERVATION/INSPECTION Posture Posture (seated): forward head, rounded shoulders, slumped in sitting.  Posture (standing): *** Posture correction: *** Anthropometrics Tremor: none Body composition: *** Muscle bulk: *** Skin: The incision sites appear to be healing well with no excessive redness, warmth, drainage or signs of infection present.  *** Edema: *** Functional Mobility Bed mobility: *** Transfers: *** Gait: grossly WFL for household and short community ambulation. More detailed gait analysis deferred to later date as needed. *** Stairs: ***  SPINE MOTION  LUMBAR SPINE AROM *Indicates pain Flexion: *** Extension: *** Side Flexion:   R ***  L *** Rotation:  R *** L *** Side glide:  R *** L ***    NEUROLOGICAL  Upper Motor Neuron Screen Babinski, Hoffman's and Clonus (ankle) negative bilaterally.  Dermatomes C2-T1 appears equal and intact to light touch except the following: *** L2-S2  appears equal and intact to light touch except the following: *** Deep Tendon Reflexes R/L  ***+/***+ Biceps brachii reflex (C5, C6) ***+/***+ Brachioradialis reflex (C6) ***+/***+ Triceps brachii reflex (C7) ***+/***+ Quadriceps reflex (L4) ***+/***+ Achilles reflex (S1)  SPINE MOTION  CERVICAL SPINE AROM *Indicates pain Flexion: *** Extension: *** Side Flexion:   R ***  L *** Rotation:  R *** L *** Protrusion: *** Retraction: ***   PERIPHERAL JOINT MOTION (in degrees)  ACTIVE RANGE OF MOTION (AROM) *Indicates pain Date Date Date  Joint/Motion R/L R/L R/L  Shoulder     Flexion / / /  Extension / / /  Abduction  / / /  External rotation / / /  Internal rotation / / /  Elbow     Flexion  / / /  Extension  / / /  Wrist     Flexion / / /  Extension  / / /  Radial deviation / / /  Ulnar deviation / / /  Pronation / / /  Supination / / /  Hip     Flexion / / /  Extension  / / /  Abduction / / /  Adduction / / /  External rotation / / /  Internal rotation  / / /  Knee     Extension / / /  Flexoin / / /  Ankle/Foot     Dorsiflexion (knee ext) / / /  Dorsiflexion (knee flex) / / /  Plantarflexion / / /  Everison / / /  Inversion / / /  Great toe extension / / /  Great toe flexion / / /  Comments:   PASSIVE RANGE OF MOTION (PROM) *Indicates pain Date Date Date  Joint/Motion R/L R/L R/L  Shoulder     Flexion / / /  Extension / / /  Abduction  / / /  External rotation / / /  Internal rotation / / /  Elbow     Flexion  / / /  Extension  / / /  Wrist     Flexion / / /  Extension  / / /  Radial deviation / / /  Ulnar deviation / / /  Pronation / / /  Supination / / /  Hip     Flexion  / / /  Extension  / / /  Abduction / / /  Adduction / / /  External rotation / / /  Internal rotation  / / /  Knee  Extension / / /  Flexion / / /  Ankle/Foot     Dorsiflexion (knee ext) / / /  Dorsiflexion (knee flex) / / /  Plantarflexion / / /   Everison / / /  Inversion / / /  Great toe extension / / /  Great toe flexion / / /  Comments:   MUSCLE PERFORMANCE (MMT):  *Indicates pain Date Date Date  Joint/Motion R/L R/L R/L  Shoulder     Flexion / / /  Abduction (C5) / / /  External rotation / / /  Internal rotation / / /  Extension / / /  Elbow     Flexion (C6) / / /  Extension (C7) / / /  Wrist     Flexion (C7) / / /  Extension (C6) / / /  Radial deviation / / /  Ulnar deviation (C8) / / /  Pronation / / /  Supination / / /  Hand     Thumb extension (C8) / / /  Finger abduction (T1) / / /  Grip (C8) / / /  Hip     Flexion (L1, L2) / / /  Extension (knee ext) / / /  Extension (knee flex) / / /  Abduction / / /  Adduction / / /  External rotation / / /  Internal rotation  / / /  Knee     Extension (L3) / / /  Flexion (S2) / / /  Ankle/Foot     Dorsiflexion (L4) / / /  Great toe extension (L5) / / /  Eversion (S1) / / /  Plantarflexion (S1) / / /  Inversion / / /  Pronation / / /  Great toe flexion / / /  Comments:   SPECIAL TESTS:  .Neurodynamictests .NeurodynamicUE .NeurodynamicLE .CspineInstability .CSPINESPECIALTESTS .SHOULDERSPECIALTESTCLUSTERS .HIPSPECIALTESTS .SIJSPECIALTESTS   SHOULDER SPECIAL TESTS RTC, Impingement, Anterior Instability (macrotrauma), Labral Tear: Painful arc test: R = ***, L = ***. Drop arm test: R = ***, L = ***. Hawkins-Kennedy test: R = ***, L = ***. Infraspinatus test: R = ***, L = ***. Apprehension test: R = ***, L = ***. Relocation test: R = ***, L = ***. Active compression test: R = ***, L = ***.  ACCESSORY MOTION: ***  PALPATION: ***  SUSTAINED POSITIONS TESTING:  ***  REPEATED MOTIONS TESTING: ***  FUNCTIONAL/BALANCE TESTS: Five Time Sit to Stand (5TSTS): *** seconds Functional Gait Assessment (FGA): ***/30 (see details above) Ten meter walking trial ( ): *** m/s Six Minute Walk Test ( ): *** feet Timed Up and Go (TUG): ***  seconds   Dynamic Gait Index: ***/24 BERG Balance Scale: ***/56 Tinetti/POMA: ***/28 Timed Up and GO: *** seconds (average of 3 trials) Trial 1: *** Trial 2: *** Trial 3: *** Romberg test: -Narrow stance, eyes open: *** seconds -Narrow stance, eyes closed: *** seconds Sharpened Romberg test: -Tandem stance, eyes open: *** seconds -Tandem stance, eyes closed: *** seconds  Narrow stance, firm surface, eyes open: *** seconds Narrow stance, firm surface, eyes closed: *** seconds Narrow stance, compliant surface, eyes open: *** seconds Narrow stance, compliant surface, eyes closed: *** seconds Single leg stance, firm surface, eyes open: R= *** seconds, L= *** seconds Single leg stance, compliant surface, eyes open: R= *** seconds, L= *** seconds Gait speed: *** m/s Functional reach test: *** inches      TREATMENT  PATIENT EDUCATION:  Education details: *** Person educated: {Person educated:25204} Education method: {Education Method:25205} Education comprehension: {Education Comprehension:25206}  HOME EXERCISE PROGRAM: ***  ASSESSMENT:  CLINICAL IMPRESSION: Patient is a 78 y.o. male referred to outpatient physical therapy with a medical diagnosis of unsteady gait when walking, at moderate risk for fall who presents with signs and symptoms consistent with ***. Patient presents with significant *** impairments that are limiting ability to complete *** without difficulty. Patient will benefit from skilled physical therapy intervention to address current body structure impairments and activity limitations to improve function and work towards goals set in current POC in order to return to prior level of function or maximal functional improvement.    OBJECTIVE IMPAIRMENTS: {opptimpairments:25111}.   ACTIVITY LIMITATIONS:  {activitylimitations:27494}  PARTICIPATION LIMITATIONS: {participationrestrictions:25113}  PERSONAL FACTORS: {Personal factors:25162} are also affecting patient's functional outcome.   REHAB POTENTIAL: {rehabpotential:25112}  CLINICAL DECISION MAKING: {clinical decision making:25114}  EVALUATION COMPLEXITY: {Evaluation complexity:25115}   GOALS: Goals reviewed with patient? {yes/no:20286}  SHORT TERM GOALS: Target date: 04/18/2023  Patient will be independent with initial home exercise program for self-management of symptoms. Baseline: {HEPbaseline4:27310} (04/04/23); Goal status: INITIAL  Patient will demonstrate improvement in Patient Specific Functional Scale (PSFS) of equal or greater than 3 points to reflect clinically significant improvement in patient's most valued functional activities.. Baseline: {Sarasgoalbaseline:32234} (04/04/23); Goal status: INITIAL  3. Patient will demonstrate improvement in {SarasSTGPRO:32232} to reflect clinically significant improvement in overall condition and self-reported functional ability. Baseline: {Sarasgoalbaseline:32234} (04/04/23); Goal status: INITIAL  4. Patient will report improvement in NPRS of equal or greater than 2 points during functional activities to improve their abilitly to complete community, work and/or recreational activities with less limitation. Baseline: ***/10 (04/04/23); Goal status: INITIAL   LONG TERM GOALS: Target date: 06/27/2023  Patient will be independent with a long-term home exercise program for self-management of symptoms.  Baseline: {HEPbaseline4:27310} (04/04/23); Goal status: INITIAL  2.  Patient will demonstrate improved {SarasLTGPRO:32233} to demonstrate improvement in overall condition and self-reported functional ability.  Baseline: {Sarasgoalbaseline:32234} (04/04/23); Goal status: INITIAL  3.  *** Baseline: {Sarasgoalbaseline:32234} (04/04/23); Goal status: INITIAL  4.  *** Baseline:  {Sarasgoalbaseline:32234} (04/04/23); Goal status: INITIAL  5.  Patient will demonstrate improvement in Patient Specific Functional Scale (PSFS) of equal or greater than 8/10 points to reflect clinically significant improvement in patient's most valued functional activities. Baseline: {Sarasgoalbaseline:32234} (04/04/23); Goal status: INITIAL  6.  Patient will report NPRS equal or less than 3/10 during functional activities during the last 2 weeks to improve their abilitly to complete community, work and/or recreational activities with less limitation. Baseline: ***/10 (04/04/23); Goal status: INITIAL   PLAN:  PT FREQUENCY: {rehab frequency:25116}  PT DURATION: {rehab duration:25117}  PLANNED INTERVENTIONS: {rehab planned interventions:25118::"97110-Therapeutic exercises","97530- Therapeutic [phone]- Neuromuscular re-education","97535- Self JXBJ","47829- Manual therapy"}.  PLAN FOR NEXT SESSION: ***   Luretha Murphy. Ilsa Iha, PT, DPT 04/04/23, 8:58 AM  [facility] [address] [place], [place] [address] P: [phone] I F: [phone]

## 2023-04-04 NOTE — Therapy (Deleted)
 OUTPATIENT PHYSICAL THERAPY EVALUATION   Patient Name: Bruce Estrada. 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   Past Surgical History:  Procedure Laterality Date   COLONOSCOPY WITH PROPOFOL N/A 10/21/2015   Procedure: COLONOSCOPY WITH PROPOFOL;  Surgeon: Earline Mayotte, MD;  Location: [facility];  Service: Endoscopy;  Laterality: N/A;   COLONOSCOPY WITH PROPOFOL N/A 11/19/2020   Procedure: COLONOSCOPY WITH PROPOFOL;  Surgeon: Earline Mayotte, MD;  Location: [facility]COPY;  Service: Endoscopy;  Laterality: N/A;   Patient Active Problem List   Diagnosis Date Noted   Malignant neoplasm of prostate (HCC) 02/11/2022   Atherosclerosis of abdominal aorta (HCC) 10/27/2020   Enlarged heart 10/27/2020   Paroxysmal A-fib (HCC) 10/27/2020   Personal history of other malignant neoplasm of skin 07/10/2019   Encounter for screening colonoscopy 10/06/2015    PCP: Katherina Right C. Arlana Pouch, MD  REFERRING PROVIDER: Elinor Parkinson, DPM  REFERRING DIAG: unsteady gait when walking, at moderate risk for fall  Rationale for Evaluation and Treatment: Rehabilitation  THERAPY DIAG:  No diagnosis found.  ONSET DATE: ***  SUBJECTIVE:                                                                                                                                                                                           SUBJECTIVE STATEMENT: ***  PERTINENT HISTORY:  Patient is a 78 y.o. male who presents to outpatient physical therapy with a referral for medical diagnosis unsteady gait when walking, at moderate risk for fall. This patient's chief complaints consist of ***, leading to the following functional deficits: ***. Relevant past medical history and comorbidities include has Atherosclerosis of abdominal aorta (HCC); Enlarged heart; Malignant neoplasm of prostate (HCC);  Paroxysmal A-fib (HCC); and Personal history of other malignant neoplasm of skin. Patient denies hx of {redflags:27294}  From DPM note 03/30/2023:  Ankle Pain  Patient states when he runs with his 2 Huskies he occasionally feels unsteady or weakness in his ankles as it may roll, questions if there is an inserts that would help, also check hallux left - clipped nail too close   PAIN:  Are you having pain? {OPRCPAIN:27236}  PRECAUTIONS: {Therapy precautions:24002}  RED FLAGS: {PT Red Flags:29287}   WEIGHT BEARING RESTRICTIONS: {Yes ***/No:24003}  FALLS:  Has patient fallen in last 6 months? {fallsyesno:27318}  LIVING ENVIRONMENT: Lives with: {OPRC lives with:25569::"lives with their family"} Lives in: {Lives in:25570} Stairs: {opstairs:27293} Has following equipment at home: {Assistive devices:23999}  OCCUPATION: ***  PLOF: {PLOF:24004}  PATIENT GOALS: ***  NEXT MD VISIT: ***  OBJECTIVE  DIAGNOSTIC FINDINGS:  ***  SELF-REPORTED FUNCTION {PROS:32229}  OBSERVATION/INSPECTION Posture Posture (seated): forward head, rounded shoulders, slumped in sitting.  Posture (standing): *** Posture correction: *** Anthropometrics Tremor: none Body composition: *** Muscle bulk: *** Skin: The incision sites appear to be healing well with no excessive redness, warmth, drainage or signs of infection present.  *** Edema: *** Functional Mobility Bed mobility: *** Transfers: *** Gait: grossly WFL for household and short community ambulation. More detailed gait analysis deferred to later date as needed. *** Stairs: ***  SPINE MOTION  LUMBAR SPINE AROM *Indicates pain Flexion: *** Extension: *** Side Flexion:   R ***  L *** Rotation:  R *** L *** Side glide:  R *** L ***    NEUROLOGICAL  Upper Motor Neuron Screen Babinski, Hoffman's and Clonus (ankle) negative bilaterally.  Dermatomes C2-T1 appears equal and intact to light touch except the following: *** L2-S2  appears equal and intact to light touch except the following: *** Deep Tendon Reflexes R/L  ***+/***+ Biceps brachii reflex (C5, C6) ***+/***+ Brachioradialis reflex (C6) ***+/***+ Triceps brachii reflex (C7) ***+/***+ Quadriceps reflex (L4) ***+/***+ Achilles reflex (S1)  SPINE MOTION  CERVICAL SPINE AROM *Indicates pain Flexion: *** Extension: *** Side Flexion:   R ***  L *** Rotation:  R *** L *** Protrusion: *** Retraction: ***   PERIPHERAL JOINT MOTION (in degrees)  ACTIVE RANGE OF MOTION (AROM) *Indicates pain Date Date Date  Joint/Motion R/L R/L R/L  Shoulder     Flexion / / /  Extension / / /  Abduction  / / /  External rotation / / /  Internal rotation / / /  Elbow     Flexion  / / /  Extension  / / /  Wrist     Flexion / / /  Extension  / / /  Radial deviation / / /  Ulnar deviation / / /  Pronation / / /  Supination / / /  Hip     Flexion / / /  Extension  / / /  Abduction / / /  Adduction / / /  External rotation / / /  Internal rotation  / / /  Knee     Extension / / /  Flexoin / / /  Ankle/Foot     Dorsiflexion (knee ext) / / /  Dorsiflexion (knee flex) / / /  Plantarflexion / / /  Everison / / /  Inversion / / /  Great toe extension / / /  Great toe flexion / / /  Comments:   PASSIVE RANGE OF MOTION (PROM) *Indicates pain Date Date Date  Joint/Motion R/L R/L R/L  Shoulder     Flexion / / /  Extension / / /  Abduction  / / /  External rotation / / /  Internal rotation / / /  Elbow     Flexion  / / /  Extension  / / /  Wrist     Flexion / / /  Extension  / / /  Radial deviation / / /  Ulnar deviation / / /  Pronation / / /  Supination / / /  Hip     Flexion  / / /  Extension  / / /  Abduction / / /  Adduction / / /  External rotation / / /  Internal rotation  / / /  Knee  Extension / / /  Flexion / / /  Ankle/Foot     Dorsiflexion (knee ext) / / /  Dorsiflexion (knee flex) / / /  Plantarflexion / / /   Everison / / /  Inversion / / /  Great toe extension / / /  Great toe flexion / / /  Comments:   MUSCLE PERFORMANCE (MMT):  *Indicates pain Date Date Date  Joint/Motion R/L R/L R/L  Shoulder     Flexion / / /  Abduction (C5) / / /  External rotation / / /  Internal rotation / / /  Extension / / /  Elbow     Flexion (C6) / / /  Extension (C7) / / /  Wrist     Flexion (C7) / / /  Extension (C6) / / /  Radial deviation / / /  Ulnar deviation (C8) / / /  Pronation / / /  Supination / / /  Hand     Thumb extension (C8) / / /  Finger abduction (T1) / / /  Grip (C8) / / /  Hip     Flexion (L1, L2) / / /  Extension (knee ext) / / /  Extension (knee flex) / / /  Abduction / / /  Adduction / / /  External rotation / / /  Internal rotation  / / /  Knee     Extension (L3) / / /  Flexion (S2) / / /  Ankle/Foot     Dorsiflexion (L4) / / /  Great toe extension (L5) / / /  Eversion (S1) / / /  Plantarflexion (S1) / / /  Inversion / / /  Pronation / / /  Great toe flexion / / /  Comments:   SPECIAL TESTS:  .Neurodynamictests .NeurodynamicUE .NeurodynamicLE .CspineInstability .CSPINESPECIALTESTS .SHOULDERSPECIALTESTCLUSTERS .HIPSPECIALTESTS .SIJSPECIALTESTS   SHOULDER SPECIAL TESTS RTC, Impingement, Anterior Instability (macrotrauma), Labral Tear: Painful arc test: R = ***, L = ***. Drop arm test: R = ***, L = ***. Hawkins-Kennedy test: R = ***, L = ***. Infraspinatus test: R = ***, L = ***. Apprehension test: R = ***, L = ***. Relocation test: R = ***, L = ***. Active compression test: R = ***, L = ***.  ACCESSORY MOTION: ***  PALPATION: ***  SUSTAINED POSITIONS TESTING:  ***  REPEATED MOTIONS TESTING: ***  FUNCTIONAL/BALANCE TESTS: Five Time Sit to Stand (5TSTS): *** seconds Functional Gait Assessment (FGA): ***/30 (see details above) Ten meter walking trial ( ): *** m/s Six Minute Walk Test ( ): *** feet Timed Up and Go (TUG): ***  seconds   Dynamic Gait Index: ***/24 BERG Balance Scale: ***/56 Tinetti/POMA: ***/28 Timed Up and GO: *** seconds (average of 3 trials) Trial 1: *** Trial 2: *** Trial 3: *** Romberg test: -Narrow stance, eyes open: *** seconds -Narrow stance, eyes closed: *** seconds Sharpened Romberg test: -Tandem stance, eyes open: *** seconds -Tandem stance, eyes closed: *** seconds  Narrow stance, firm surface, eyes open: *** seconds Narrow stance, firm surface, eyes closed: *** seconds Narrow stance, compliant surface, eyes open: *** seconds Narrow stance, compliant surface, eyes closed: *** seconds Single leg stance, firm surface, eyes open: R= *** seconds, L= *** seconds Single leg stance, compliant surface, eyes open: R= *** seconds, L= *** seconds Gait speed: *** m/s Functional reach test: *** inches      TREATMENT  PATIENT EDUCATION:  Education details: *** Person educated: {Person educated:25204} Education method: {Education Method:25205} Education comprehension: {Education Comprehension:25206}  HOME EXERCISE PROGRAM: ***  ASSESSMENT:  CLINICAL IMPRESSION: Patient is a 78 y.o. male referred to outpatient physical therapy with a medical diagnosis of unsteady gait when walking, at moderate risk for fall who presents with signs and symptoms consistent with ***. Patient presents with significant *** impairments that are limiting ability to complete *** without difficulty. Patient will benefit from skilled physical therapy intervention to address current body structure impairments and activity limitations to improve function and work towards goals set in current POC in order to return to prior level of function or maximal functional improvement.    OBJECTIVE IMPAIRMENTS: {opptimpairments:25111}.   ACTIVITY LIMITATIONS:  {activitylimitations:27494}  PARTICIPATION LIMITATIONS: {participationrestrictions:25113}  PERSONAL FACTORS: {Personal factors:25162} are also affecting patient's functional outcome.   REHAB POTENTIAL: {rehabpotential:25112}  CLINICAL DECISION MAKING: {clinical decision making:25114}  EVALUATION COMPLEXITY: {Evaluation complexity:25115}   GOALS: Goals reviewed with patient? {yes/no:20286}  SHORT TERM GOALS: Target date: 04/18/2023  Patient will be independent with initial home exercise program for self-management of symptoms. Baseline: {HEPbaseline4:27310} (04/04/23); Goal status: INITIAL  Patient will demonstrate improvement in Patient Specific Functional Scale (PSFS) of equal or greater than 3 points to reflect clinically significant improvement in patient's most valued functional activities.. Baseline: {Sarasgoalbaseline:32234} (04/04/23); Goal status: INITIAL  3. Patient will demonstrate improvement in {SarasSTGPRO:32232} to reflect clinically significant improvement in overall condition and self-reported functional ability. Baseline: {Sarasgoalbaseline:32234} (04/04/23); Goal status: INITIAL  4. Patient will report improvement in NPRS of equal or greater than 2 points during functional activities to improve their abilitly to complete community, work and/or recreational activities with less limitation. Baseline: ***/10 (04/04/23); Goal status: INITIAL   LONG TERM GOALS: Target date: 06/27/2023  Patient will be independent with a long-term home exercise program for self-management of symptoms.  Baseline: {HEPbaseline4:27310} (04/04/23); Goal status: INITIAL  2.  Patient will demonstrate improved {SarasLTGPRO:32233} to demonstrate improvement in overall condition and self-reported functional ability.  Baseline: {Sarasgoalbaseline:32234} (04/04/23); Goal status: INITIAL  3.  *** Baseline: {Sarasgoalbaseline:32234} (04/04/23); Goal status: INITIAL  4.  *** Baseline:  {Sarasgoalbaseline:32234} (04/04/23); Goal status: INITIAL  5.  Patient will demonstrate improvement in Patient Specific Functional Scale (PSFS) of equal or greater than 8/10 points to reflect clinically significant improvement in patient's most valued functional activities. Baseline: {Sarasgoalbaseline:32234} (04/04/23); Goal status: INITIAL  6.  Patient will report NPRS equal or less than 3/10 during functional activities during the last 2 weeks to improve their abilitly to complete community, work and/or recreational activities with less limitation. Baseline: ***/10 (04/04/23); Goal status: INITIAL   PLAN:  PT FREQUENCY: {rehab frequency:25116}  PT DURATION: {rehab duration:25117}  PLANNED INTERVENTIONS: {rehab planned interventions:25118::"97110-Therapeutic exercises","97530- Therapeutic [phone]- Neuromuscular re-education","97535- Self JXBJ","47829- Manual therapy"}.  PLAN FOR NEXT SESSION: ***   Luretha Murphy. Ilsa Iha, PT, DPT 04/04/23, 9:15 PM  [facility] [address] [place], [place] [address] P: [phone] I F: [phone]

## 2023-04-06 ENCOUNTER — Ambulatory Visit: Admitting: Physical Therapy

## 2023-04-11 ENCOUNTER — Ambulatory Visit: Attending: Podiatry | Admitting: Physical Therapy

## 2023-04-11 ENCOUNTER — Encounter: Payer: Self-pay | Admitting: Physical Therapy

## 2023-04-11 ENCOUNTER — Ambulatory Visit: Admitting: Physical Therapy

## 2023-04-11 DIAGNOSIS — R2681 Unsteadiness on feet: Secondary | ICD-10-CM | POA: Insufficient documentation

## 2023-04-11 DIAGNOSIS — Z9181 History of falling: Secondary | ICD-10-CM | POA: Insufficient documentation

## 2023-04-11 NOTE — Therapy (Signed)
 OUTPATIENT PHYSICAL THERAPY EVALUATION   Patient Name: Bruce Estrada. MRN: [id number] DOB:1945/10/19, 78 y.o., male Today's Date: 04/11/2023  END OF SESSION:  PT End of Session - 04/11/23 2054     Visit Number 1    Number of Visits 13    Date for PT Re-Evaluation 07/04/23    Authorization Type [employer] MEDICARE  reporting period from 04/11/2023    Progress Note Due on Visit 10    PT Start Time 1434    PT Stop Time 1526    PT Time Calculation (min) 52 min    Activity Tolerance Patient tolerated treatment well    Behavior During Therapy [facility] for tasks assessed/performed             Past Medical History:  Diagnosis Date   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   Past Surgical History:  Procedure Laterality Date   COLONOSCOPY WITH PROPOFOL N/A 10/21/2015   Procedure: COLONOSCOPY WITH PROPOFOL;  Surgeon: Earline Mayotte, MD;  Location: [facility];  Service: Endoscopy;  Laterality: N/A;   COLONOSCOPY WITH PROPOFOL N/A 11/19/2020   Procedure: COLONOSCOPY WITH PROPOFOL;  Surgeon: Earline Mayotte, MD;  Location: [facility];  Service: Endoscopy;  Laterality: N/A;   Patient Active Problem List   Diagnosis Date Noted   Malignant neoplasm of prostate (HCC) 02/11/2022   Atherosclerosis of abdominal aorta (HCC) 10/27/2020   Enlarged heart 10/27/2020   Paroxysmal A-fib (HCC) 10/27/2020   Personal history of other malignant neoplasm of skin 07/10/2019   Encounter for screening colonoscopy 10/06/2015    PCP: Katherina Right C. Arlana Pouch, MD  REFERRING PROVIDER: Elinor Parkinson, DPM  REFERRING DIAG: unsteady gait when walking, at moderate risk for fall  Rationale for Evaluation and Treatment: Rehabilitation  THERAPY DIAG:  Unsteadiness on feet  History of falling  ONSET DATE: 03/30/2023  SUBJECTIVE:                                                                                                                                                                                            SUBJECTIVE STATEMENT: Patient states he was walking his 2 huskies and a garbage truck went by and two loose dogs were following the truck that disturbed his dogs. He was walking on even ground with his dogs, but when he stepped to get out of the way he stepped on uneven ground he fell and hit his left knee. Since then he has switched shoes and put in some inserts in. He feels much more stable in the inserts. He has his huskies calmed down so they don't pull much. He can walk them  with one hand and they respond to voice commands.   Patient states he had another fall at the vet on ice when his huskies chased another dog. He landed on his buttocks. He states he has had some stiffness in his coccyx when he sits for a while but he manages this stretching. He said the podiatrist thought he might need some help with balance. Patient has been wearing new inserts for a week and since he has been on more even roads, he is feeling much better about it.    PERTINENT HISTORY:  Patient is a 78 y.o. male who presents to outpatient physical therapy with a referral for medical diagnosis unsteady gait when walking, at moderate risk for fall. This patient's chief complaints consist of occasional falls and unsteadiness on feet with 2 falls in last 6 months, mild low back pain, and left anterior knee pain (from one of the falls) leading to the following functional deficits: is extra careful with how he moves so he does not fall, limited footwear. Relevant past medical history and comorbidities include has Atherosclerosis of abdominal aorta (HCC); Enlarged heart; Malignant neoplasm of prostate (HCC); Paroxysmal A-fib (HCC); and Personal history of other malignant neoplasm of skin. Patient denies hx of stroke, seizures, lung problems, diabetes, unexplained weight loss, unexplained changes in bowel or bladder problems, unexplained stumbling or dropping things, osteoporosis, and spinal surgery  He is being treated for prostate  cancer (finishing final stages of treatment after being treated for 10 years).   Exercise history: currently 2 miles (minimum)/day with 2 huskies every day, stretches in the morning or anytime he can to get his joints moving.   Lives alone since his wife had a stroke about 9 months ago and she has not been able to return home since she needs 24/7 care (provided by his daughter at her home).    He has some concerns about the anterior left knee that pops when he moves it after his fall.   PAIN: Are you having pain? Yes NPRS: Current: 0/10,  Best: 0/10, Worst: 6-7/10. Pain location: bottom of back, left anterior knee Aggravating factors: prolonged sleeping and sitting Relieving factors: stretching/exercises before getting out of bed  FUNCTIONAL LIMITATIONS: has to be more careful and has limited footwear  LEISURE: 2 huskies, walking for exercise, sports and basketball (not playing it)  PRECAUTIONS: None  WEIGHT BEARING RESTRICTIONS: No  FALLS:  Has patient fallen in last 6 months? Yes. Number of falls 2 once with his huskies when he stepped on uneven ground when they chased some loose dogs and once when he took his huskies to the vet and he was walking them to go to the bathroom and they chased a dog while he was standing on ice  LIVING ENVIRONMENT: Lives with: alone Lives in: 2 story home with full basement; he walks to the outside Stairs: 40 steps from top floor to the basement with 2 handrails. 3 steps to get into the house with handrail on one side (getting railings for the other side). He needs his steps wider to accommodate his shoes.  Has following equipment at home: does not need to use.   OCCUPATION: retired after his wife's stroke 9 months ago  PLOF: Independent  PATIENT GOALS: unsure what to expect. Would like to know if there is something in his gait or walking that is causing his low back pain. Wants to know if there is something he can change to help.     OBJECTIVE  SELF-REPORTED FUNCTION Activities-Specific Balance Confidence (ABC) Scale: 96.25  OBSERVATION/INSPECTION Posture Posture (standing): WFL for basic mobility Anthropometrics Tremor: none Body composition: 24.65 Muscle bulk: WNL Edema: none visualized Functional Mobility Deep squat: able to squat buttocks to heels on balls of feet with intermittant UE support.  Transfers: sit <> stand with no UE support.  Gait: grossly WFL for household and short community ambulation. More detailed gait analysis deferred to later date as needed.     PERIPHERAL JOINT MOTION (in degrees) Appears grossly WFL for basic mobility  MUSCLE PERFORMANCE (MMT):  B ankle DF 5/5 Able to heel and toe walk without loss of height or UE support.   FUNCTIONAL/BALANCE TESTS:  MiniBEST: 25/28      TREATMENT                                                                                                                              PATIENT EDUCATION:  Education details: Education on diagnosis, prognosis, POC, anatomy and physiology of current condition.  Person educated: Patient Education method: Medical illustrator Education comprehension: verbalized understanding and needs further education  HOME EXERCISE PROGRAM: TBD  ASSESSMENT:  CLINICAL IMPRESSION: Patient is a 78 y.o. male referred to outpatient physical therapy with a medical diagnosis of unsteady gait when walking, at moderate risk for fall who presents with signs and symptoms consistent with mild unsteadiness on feet with history of 2 falls with mild injury in the last 6 months. MiniBEST assessment shows he has limitations with backwards reactive stepping, maintaining balance on an incline, single leg stance, and dual tasking while performing dynamic walking activity (TUG). Although patient does not show severe deficits, he would benefit from education and exercises to help improve these impairments as well as possible  further exam for the mild knee R pain he reports from one of his falls. Patient will benefit from skilled physical therapy intervention to address current body structure impairments and activity limitations to improve function and work towards goals set in current POC in order to return to prior level of function or maximal functional improvement.    OBJECTIVE IMPAIRMENTS: decreased balance, decreased cognition, decreased coordination, decreased knowledge of condition, decreased mobility, and pain.   ACTIVITY LIMITATIONS: carrying, locomotion level, and walking/jogging with his 2 dogs, walking on uneven or icy ground  PARTICIPATION LIMITATIONS: interpersonal relationship, community activity, and walking/jogging with his 2 dogs, caring for his dogs  PERSONAL FACTORS: Age, Past/current experiences, and 3+ comorbidities:   Atherosclerosis of abdominal aorta (HCC); Enlarged heart; Malignant neoplasm of prostate (HCC); Paroxysmal A-fib (HCC); memory impairments, and Personal history of other malignant neoplasm of skin are also affecting patient's functional outcome.   REHAB POTENTIAL: Good  CLINICAL DECISION MAKING: Stable/uncomplicated  EVALUATION COMPLEXITY: Low   GOALS: Goals reviewed with patient? No  SHORT TERM GOALS: Target date: 04/25/2023  Patient will be independent with initial home exercise program for self-management of symptoms. Baseline: Initial HEP to be provided at visit  2 as appropriate (04/11/23); Goal status: INITIAL   LONG TERM GOALS: Target date: 07/04/2023  Patient will be independent with a long-term home exercise program for self-management of symptoms.  Baseline: Initial HEP to be provided at visit 2 as appropriate (04/11/23); Goal status: INITIAL  2.  Patient will demonstrate improved Activities-Specific Balance Confidence (ABC) scale to equal or greater than 99% to demonstrate improvement in overall condition and self-reported functional ability.  Baseline: 96.25  (04/11/23); Goal status: INITIAL  3.  Patient will improve his MiniBEST score to equal or greater than 27/28 to demonstrate improvements in his areas of deficits (single leg stance, backwards reactive balance, eyes closed on slant board, and dual tasking) which will decrease his fall risk.  Baseline:25/28 (04/11/23); Goal status: INITIAL  4.  Patient will demonstrate improvement in Patient Specific Functional Scale (PSFS) of equal or greater than 8/10 points to reflect clinically significant improvement in patient's most valued functional activities. Baseline: to be measured at visit 2 as appropriate (04/11/23); Goal status: INITIAL    PLAN:  PT FREQUENCY: 1-2x/week  PT DURATION: 6-12 weeks  PLANNED INTERVENTIONS: 97164- PT Re-evaluation, 97110-Therapeutic exercises, 97530- Therapeutic activity, 97112- Neuromuscular re-education, 97535- Self Care, 09811- Manual therapy, [phone]- Gait training, [phone]- Electrical stimulation (unattended), Patient/Family education, Balance training, Dry Needling, Joint mobilization, Spinal mobilization, Cryotherapy, and Moist heat.  PLAN FOR NEXT SESSION: start balance exercises targetting identified weakness on MiniBEST, strength testing.    Luretha Murphy. Ilsa Iha, PT, DPT 04/11/23, 8:57 PM  [facility] [address] [place], [place] [address] P: [phone] I F: [phone]

## 2023-04-12 NOTE — Therapy (Deleted)
 OUTPATIENT PHYSICAL THERAPY TREATMENT   Patient Name: Bruce Estrada. 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   Past Surgical History:  Procedure Laterality Date   COLONOSCOPY WITH PROPOFOL N/A 10/21/2015   Procedure: COLONOSCOPY WITH PROPOFOL;  Surgeon: Earline Mayotte, MD;  Location: [facility] ENDOSCOPY;  Service: Endoscopy;  Laterality: N/A;   COLONOSCOPY WITH PROPOFOL N/A 11/19/2020   Procedure: COLONOSCOPY WITH PROPOFOL;  Surgeon: Earline Mayotte, MD;  Location: [facility] ENDOSCOPY;  Service: Endoscopy;  Laterality: N/A;   Patient Active Problem List   Diagnosis Date Noted   Malignant neoplasm of prostate (HCC) 02/11/2022   Atherosclerosis of abdominal aorta (HCC) 10/27/2020   Enlarged heart 10/27/2020   Paroxysmal A-fib (HCC) 10/27/2020   Personal history of other malignant neoplasm of skin 07/10/2019   Encounter for screening colonoscopy 10/06/2015    PCP: Katherina Right C. Arlana Pouch, MD  REFERRING PROVIDER: Elinor Parkinson, DPM  REFERRING DIAG: unsteady gait when walking, at moderate risk for fall  Rationale for Evaluation and Treatment: Rehabilitation  THERAPY DIAG:  No diagnosis found.  ONSET DATE: 03/30/2023  SUBJECTIVE:                                                                                                                                                                                           PERTINENT HISTORY:  Patient is a 78 y.o. male who presents to outpatient physical therapy with a referral for medical diagnosis unsteady gait when walking, at moderate risk for fall. This patient's chief complaints consist of occasional falls and unsteadiness on feet with 2 falls in last 6 months, mild low back pain, and left anterior knee pain (from one of the falls) leading to the following functional deficits: is extra careful with how he moves so he  does not fall, limited footwear. Relevant past medical history and comorbidities include has Atherosclerosis of abdominal aorta (HCC); Enlarged heart; Malignant neoplasm of prostate (HCC); Paroxysmal A-fib (HCC); and Personal history of other malignant neoplasm of skin. Patient denies hx of stroke, seizures, lung problems, diabetes, unexplained weight loss, unexplained changes in bowel or bladder problems, unexplained stumbling or dropping things, osteoporosis, and spinal surgery  He is being treated for prostate cancer (finishing final stages of treatment after being treated for 10 years).   Exercise history: currently 2 miles (minimum)/day with 2 huskies every day, stretches in the morning or anytime he can to get his joints moving.   Lives alone since his wife had a stroke about 69  months ago and she has not been able to return home since she needs 24/7 care (provided by his daughter at her home).   He has some concerns about the anterior left knee that pops when he moves it after his fall  SUBJECTIVE STATEMENT: ***  PAIN: NPRS: ***  PRECAUTIONS: None  FALLS:  Has patient fallen in last 6 months? Yes. Number of falls 2 once with his huskies when he stepped on uneven ground when they chased some loose dogs and once when he took his huskies to the vet and he was walking them to go to the bathroom and they chased a dog while he was standing on ice   PATIENT GOALS: unsure what to expect. Would like to know if there is something in his gait or walking that is causing his low back pain. Wants to know if there is something he can change to help.    OBJECTIVE  There were no vitals filed for this visit.  SELF-REPORTED FUNCTION Patient Specific Functional Scale (PSFS)  ***: *** ***: *** ***: *** Average: ***    TREATMENT                                                                                                                              PATIENT EDUCATION:  Education details:  Education on diagnosis, prognosis, POC, anatomy and physiology of current condition.  Person educated: Patient Education method: Medical illustrator Education comprehension: verbalized understanding and needs further education  HOME EXERCISE PROGRAM: TBD  ASSESSMENT:  CLINICAL IMPRESSION: ***  From initial PT evaluation on 04/11/2023:  Patient is a 78 y.o. male referred to outpatient physical therapy with a medical diagnosis of unsteady gait when walking, at moderate risk for fall who presents with signs and symptoms consistent with mild unsteadiness on feet with history of 2 falls with mild injury in the last 6 months. MiniBEST assessment shows he has limitations with backwards reactive stepping, maintaining balance on an incline, single leg stance, and dual tasking while performing dynamic walking activity (TUG). Although patient does not show severe deficits, he would benefit from education and exercises to help improve these impairments as well as possible further exam for the mild knee R pain he reports from one of his falls. Patient will benefit from skilled physical therapy intervention to address current body structure impairments and activity limitations to improve function and work towards goals set in current POC in order to return to prior level of function or maximal functional improvement.    OBJECTIVE IMPAIRMENTS: decreased balance, decreased cognition, decreased coordination, decreased knowledge of condition, decreased mobility, and pain.   ACTIVITY LIMITATIONS: carrying, locomotion level, and walking/jogging with his 2 dogs, walking on uneven or icy ground  PARTICIPATION LIMITATIONS: interpersonal relationship, community activity, and walking/jogging with his 2 dogs, caring for his dogs  PERSONAL FACTORS: Age, Past/current experiences, and 3+ comorbidities:   Atherosclerosis of abdominal aorta (HCC); Enlarged heart; Malignant neoplasm of  prostate (HCC); Paroxysmal  A-fib (HCC); memory impairments, and Personal history of other malignant neoplasm of skin are also affecting patient's functional outcome.   REHAB POTENTIAL: Good  CLINICAL DECISION MAKING: Stable/uncomplicated  EVALUATION COMPLEXITY: Low   GOALS: Goals reviewed with patient? No  SHORT TERM GOALS: Target date: 04/25/2023  Patient will be independent with initial home exercise program for self-management of symptoms. Baseline: Initial HEP to be provided at visit 2 as appropriate (04/11/23); Goal status: In progress   LONG TERM GOALS: Target date: 07/04/2023  Patient will be independent with a long-term home exercise program for self-management of symptoms.  Baseline: Initial HEP to be provided at visit 2 as appropriate (04/11/23); Goal status: In progress  2.  Patient will demonstrate improved Activities-Specific Balance Confidence (ABC) scale to equal or greater than 99% to demonstrate improvement in overall condition and self-reported functional ability.  Baseline: 96.25 (04/11/23); Goal status: In progress  3.  Patient will improve his MiniBEST score to equal or greater than 27/28 to demonstrate improvements in his areas of deficits (single leg stance, backwards reactive balance, eyes closed on slant board, and dual tasking) which will decrease his fall risk.  Baseline: 25/28 (04/11/23); Goal status: In progress  4.  Patient will demonstrate improvement in Patient Specific Functional Scale (PSFS) of equal or greater than 8/10 points to reflect clinically significant improvement in patient's most valued functional activities. Baseline: to be measured at visit 2 as appropriate (04/11/23); Goal status: In progress    PLAN:  PT FREQUENCY: 1-2x/week  PT DURATION: 6-12 weeks  PLANNED INTERVENTIONS: 97164- PT Re-evaluation, 97110-Therapeutic exercises, 97530- Therapeutic activity, 97112- Neuromuscular re-education, 97535- Self Care, 44010- Manual therapy, [phone]- Gait training,  [phone]- Electrical stimulation (unattended), Patient/Family education, Balance training, Dry Needling, Joint mobilization, Spinal mobilization, Cryotherapy, and Moist heat.  PLAN FOR NEXT SESSION: start balance exercises targetting identified weakness on MiniBEST, strength testing.    Luretha Murphy. Ilsa Iha, PT, DPT 04/12/23, 8:22 PM  [facility] [address] [place], [place] [address] P: [phone] I F: [phone]

## 2023-04-13 ENCOUNTER — Ambulatory Visit: Admitting: Physical Therapy

## 2023-04-13 ENCOUNTER — Telehealth: Payer: Self-pay | Admitting: Physical Therapy

## 2023-04-13 NOTE — Telephone Encounter (Signed)
 Called patient when he did not show up for his PT appointment scheduled at 11:15am. Phone went to VM then disconnected.   Bruce Estrada. Ilsa Iha, PT, DPT 04/13/23, 6:08 PM  [facility] [address] [place], [place] [address] P: [phone] I F: [phone].

## 2023-04-18 ENCOUNTER — Ambulatory Visit: Admitting: Physical Therapy

## 2023-04-18 ENCOUNTER — Encounter: Payer: Self-pay | Admitting: Physical Therapy

## 2023-04-18 NOTE — Therapy (Deleted)
 OUTPATIENT PHYSICAL THERAPY TREATMENT   Patient Name: Bruce Estrada. 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   Past Surgical History:  Procedure Laterality Date   COLONOSCOPY WITH PROPOFOL N/A 10/21/2015   Procedure: COLONOSCOPY WITH PROPOFOL;  Surgeon: Earline Mayotte, MD;  Location: [facility] ENDOSCOPY;  Service: Endoscopy;  Laterality: N/A;   COLONOSCOPY WITH PROPOFOL N/A 11/19/2020   Procedure: COLONOSCOPY WITH PROPOFOL;  Surgeon: Earline Mayotte, MD;  Location: [facility] ENDOSCOPY;  Service: Endoscopy;  Laterality: N/A;   Patient Active Problem List   Diagnosis Date Noted   Malignant neoplasm of prostate (HCC) 02/11/2022   Atherosclerosis of abdominal aorta (HCC) 10/27/2020   Enlarged heart 10/27/2020   Paroxysmal A-fib (HCC) 10/27/2020   Personal history of other malignant neoplasm of skin 07/10/2019   Encounter for screening colonoscopy 10/06/2015    PCP: Katherina Right C. Arlana Pouch, MD  REFERRING PROVIDER: Elinor Parkinson, DPM  REFERRING DIAG: unsteady gait when walking, at moderate risk for fall  Rationale for Evaluation and Treatment: Rehabilitation  THERAPY DIAG:  No diagnosis found.  ONSET DATE: 03/30/2023  SUBJECTIVE:                                                                                                                                                                                           PERTINENT HISTORY:  Patient is a 78 y.o. male who presents to outpatient physical therapy with a referral for medical diagnosis unsteady gait when walking, at moderate risk for fall. This patient's chief complaints consist of occasional falls and unsteadiness on feet with 2 falls in last 6 months, mild low back pain, and left anterior knee pain (from one of the falls) leading to the following functional deficits: is extra careful with how he moves so he  does not fall, limited footwear. Relevant past medical history and comorbidities include has Atherosclerosis of abdominal aorta (HCC); Enlarged heart; Malignant neoplasm of prostate (HCC); Paroxysmal A-fib (HCC); and Personal history of other malignant neoplasm of skin. Patient denies hx of stroke, seizures, lung problems, diabetes, unexplained weight loss, unexplained changes in bowel or bladder problems, unexplained stumbling or dropping things, osteoporosis, and spinal surgery  He is being treated for prostate cancer (finishing final stages of treatment after being treated for 10 years).   Exercise history: currently 2 miles (minimum)/day with 2 huskies every day, stretches in the morning or anytime he can to get his joints moving.   Lives alone since his wife had a stroke about 2  months ago and she has not been able to return home since she needs 24/7 care (provided by his daughter at her home).   He has some concerns about the anterior left knee that pops when he moves it after his fall  SUBJECTIVE STATEMENT: ***  PAIN: NPRS: ***  PRECAUTIONS: None  FALLS:  Has patient fallen in last 6 months? Yes. Number of falls 2 once with his huskies when he stepped on uneven ground when they chased some loose dogs and once when he took his huskies to the vet and he was walking them to go to the bathroom and they chased a dog while he was standing on ice   PATIENT GOALS: unsure what to expect. Would like to know if there is something in his gait or walking that is causing his low back pain. Wants to know if there is something he can change to help.    OBJECTIVE  There were no vitals filed for this visit.  SELF-REPORTED FUNCTION Patient Specific Functional Scale (PSFS)  ***: *** ***: *** ***: *** Average: ***    TREATMENT                                                                                                                              PATIENT EDUCATION:  Education details:  Education on diagnosis, prognosis, POC, anatomy and physiology of current condition.  Person educated: Patient Education method: Medical illustrator Education comprehension: verbalized understanding and needs further education  HOME EXERCISE PROGRAM: TBD  ASSESSMENT:  CLINICAL IMPRESSION: ***  From initial PT evaluation on 04/11/2023:  Patient is a 78 y.o. male referred to outpatient physical therapy with a medical diagnosis of unsteady gait when walking, at moderate risk for fall who presents with signs and symptoms consistent with mild unsteadiness on feet with history of 2 falls with mild injury in the last 6 months. MiniBEST assessment shows he has limitations with backwards reactive stepping, maintaining balance on an incline, single leg stance, and dual tasking while performing dynamic walking activity (TUG). Although patient does not show severe deficits, he would benefit from education and exercises to help improve these impairments as well as possible further exam for the mild knee R pain he reports from one of his falls. Patient will benefit from skilled physical therapy intervention to address current body structure impairments and activity limitations to improve function and work towards goals set in current POC in order to return to prior level of function or maximal functional improvement.    OBJECTIVE IMPAIRMENTS: decreased balance, decreased cognition, decreased coordination, decreased knowledge of condition, decreased mobility, and pain.   ACTIVITY LIMITATIONS: carrying, locomotion level, and walking/jogging with his 2 dogs, walking on uneven or icy ground  PARTICIPATION LIMITATIONS: interpersonal relationship, community activity, and walking/jogging with his 2 dogs, caring for his dogs  PERSONAL FACTORS: Age, Past/current experiences, and 3+ comorbidities:   Atherosclerosis of abdominal aorta (HCC); Enlarged heart; Malignant neoplasm of  prostate (HCC); Paroxysmal  A-fib (HCC); memory impairments, and Personal history of other malignant neoplasm of skin are also affecting patient's functional outcome.   REHAB POTENTIAL: Good  CLINICAL DECISION MAKING: Stable/uncomplicated  EVALUATION COMPLEXITY: Low   GOALS: Goals reviewed with patient? No  SHORT TERM GOALS: Target date: 04/25/2023  Patient will be independent with initial home exercise program for self-management of symptoms. Baseline: Initial HEP to be provided at visit 2 as appropriate (04/11/23); Goal status: In progress   LONG TERM GOALS: Target date: 07/04/2023  Patient will be independent with a long-term home exercise program for self-management of symptoms.  Baseline: Initial HEP to be provided at visit 2 as appropriate (04/11/23); Goal status: In progress  2.  Patient will demonstrate improved Activities-Specific Balance Confidence (ABC) scale to equal or greater than 99% to demonstrate improvement in overall condition and self-reported functional ability.  Baseline: 96.25 (04/11/23); Goal status: In progress  3.  Patient will improve his MiniBEST score to equal or greater than 27/28 to demonstrate improvements in his areas of deficits (single leg stance, backwards reactive balance, eyes closed on slant board, and dual tasking) which will decrease his fall risk.  Baseline: 25/28 (04/11/23); Goal status: In progress  4.  Patient will demonstrate improvement in Patient Specific Functional Scale (PSFS) of equal or greater than 8/10 points to reflect clinically significant improvement in patient's most valued functional activities. Baseline: to be measured at visit 2 as appropriate (04/11/23); Goal status: In progress    PLAN:  PT FREQUENCY: 1-2x/week  PT DURATION: 6-12 weeks  PLANNED INTERVENTIONS: 97164- PT Re-evaluation, 97110-Therapeutic exercises, 97530- Therapeutic activity, 97112- Neuromuscular re-education, 97535- Self Care, [address]- Manual therapy, [phone]- Gait training,  [phone]- Electrical stimulation (unattended), Patient/Family education, Balance training, Dry Needling, Joint mobilization, Spinal mobilization, Cryotherapy, and Moist heat.  PLAN FOR NEXT SESSION: start balance exercises targetting identified weakness on MiniBEST, strength testing.    Luretha Murphy. Ilsa Iha, PT, DPT 04/18/23, 9:42 AM  [facility] [address] [place], [place] [address] P: [phone] I F: [phone]

## 2023-04-21 ENCOUNTER — Encounter: Payer: Self-pay | Admitting: Physical Therapy

## 2023-04-21 ENCOUNTER — Ambulatory Visit: Attending: Podiatry | Admitting: Physical Therapy

## 2023-04-21 VITALS — BP 131/75 | HR 55

## 2023-04-21 DIAGNOSIS — Z9181 History of falling: Secondary | ICD-10-CM | POA: Insufficient documentation

## 2023-04-21 DIAGNOSIS — R2681 Unsteadiness on feet: Secondary | ICD-10-CM | POA: Diagnosis present

## 2023-04-21 NOTE — Therapy (Signed)
 OUTPATIENT PHYSICAL THERAPY TREATMENT   Patient Name: Bruce Estrada. MRN: [id number] DOB:December 06, 1945, 78 y.o., male Today's Date: 04/21/2023  END OF SESSION:  PT End of Session - 04/21/23 1316     Visit Number 2    Number of Visits 13    Date for PT Re-Evaluation 07/04/23    Authorization Type [employer] MEDICARE  reporting period from 04/11/2023    Progress Note Due on Visit 10    PT Start Time 1311    PT Stop Time 1352    PT Time Calculation (min) 41 min    Activity Tolerance Patient tolerated treatment well    Behavior During Therapy [facility] for tasks assessed/performed              Past Medical History:  Diagnosis Date   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   Past Surgical History:  Procedure Laterality Date   COLONOSCOPY WITH PROPOFOL N/A 10/21/2015   Procedure: COLONOSCOPY WITH PROPOFOL;  Surgeon: Earline Mayotte, MD;  Location: [facility] ENDOSCOPY;  Service: Endoscopy;  Laterality: N/A;   COLONOSCOPY WITH PROPOFOL N/A 11/19/2020   Procedure: COLONOSCOPY WITH PROPOFOL;  Surgeon: Earline Mayotte, MD;  Location: [facility] ENDOSCOPY;  Service: Endoscopy;  Laterality: N/A;   Patient Active Problem List   Diagnosis Date Noted   Malignant neoplasm of prostate (HCC) 02/11/2022   Atherosclerosis of abdominal aorta (HCC) 10/27/2020   Enlarged heart 10/27/2020   Paroxysmal A-fib (HCC) 10/27/2020   Personal history of other malignant neoplasm of skin 07/10/2019   Encounter for screening colonoscopy 10/06/2015    PCP: Katherina Right C. Arlana Pouch, MD  REFERRING PROVIDER: Elinor Parkinson, DPM  REFERRING DIAG: unsteady gait when walking, at moderate risk for fall  Rationale for Evaluation and Treatment: Rehabilitation  THERAPY DIAG:  Unsteadiness on feet  History of falling  ONSET DATE: 03/30/2023  SUBJECTIVE:                                                                                                                                                                                            PERTINENT HISTORY:  Patient is a 78 y.o. male who presents to outpatient physical therapy with a referral for medical diagnosis unsteady gait when walking, at moderate risk for fall. This patient's chief complaints consist of occasional falls and unsteadiness on feet with 2 falls in last 6 months, mild low back pain, and left anterior knee pain (from one of the falls) leading to the following functional deficits: is extra careful with how he moves so he does not fall, limited footwear. Relevant past medical history and comorbidities include has Atherosclerosis of  abdominal aorta (HCC); Enlarged heart; Malignant neoplasm of prostate (HCC); Paroxysmal A-fib (HCC); and Personal history of other malignant neoplasm of skin. Patient denies hx of stroke, seizures, lung problems, diabetes, unexplained weight loss, unexplained changes in bowel or bladder problems, unexplained stumbling or dropping things, osteoporosis, and spinal surgery  He is being treated for prostate cancer (finishing final stages of treatment after being treated for 10 years).   Exercise history: currently 2 miles (minimum)/day with 2 huskies every day, stretches in the morning or anytime he can to get his joints moving.   Lives alone since his wife had a stroke about 9 months ago and she has not been able to return home since she needs 24/7 care (provided by his daughter at her home).   He has some concerns about the anterior left knee that pops when he moves it after his fall  SUBJECTIVE STATEMENT: Patient states he is feeling pretty good except he is stressed about being late. He states any time near lunch or dinner the traffic is bad.   PAIN: NPRS: 0/10  PRECAUTIONS: None  FALLS:  Has patient fallen in last 6 months? Yes. Number of falls 2 once with his huskies when he stepped on uneven ground when they chased some loose dogs and once when he took his huskies to the vet and he was walking them to go to the bathroom and they  chased a dog while he was standing on ice   PATIENT GOALS: unsure what to expect. Would like to know if there is something in his gait or walking that is causing his low back pain. Wants to know if there is something he can change to help.    OBJECTIVE  Vitals:   04/21/23 1316  BP: 131/75  Pulse: (!) 55  SpO2: 100%    SELF-REPORTED FUNCTION Patient Specific Functional Scale (PSFS)  Making a left turn: 7-8 Walking on uneven ground: 6-7 Average: 7/10  TREATMENT                                                                                                                             Neuromuscular Re-education: a technique or exercise performed with the goal of improving the level of communication between the body and the brain, such as for balance, motor control, muscle activation patterns, coordination, desensitization, quality of muscle contraction, proprioception, and/or kinesthetic sense needed for successful and safe completion of functional activities.   Vitals check for systems review and to screen for safety of exercise.   Static balance with back to corner to teach how to perform at home:  Tandem stance eyes open 1x30 seconds each side Tandem stance eyes closed:  1x30 seconds each side Occasional balance checks touching wall Tandem stance eyes open head rotations 1x30 seconds each side Occasional balance checks touching wall  Single leg stance balance with 0-1 finger on sink for balance 2x30 seconds each side  Walking backwards:  4x20 feet with SBA  for safety   Standing ankle PF/DF on rocker-board  1x20 with occasional balance checks with UE support on TM  SBA for safety  Standing ankle PF/DF on flat side of BOSU 1x20 with occasional balance checks with UE support on TM  SBA for safety  Education on HEP including handout and education/practice on how to use phone for a timer.   Assistance with understanding schedule to improve attendance and timeliness to  appointments. Education on cognitive support that may be available through seeing SLP (pt not ready to take action at this time).   Pt required multimodal cuing for proper technique and to facilitate improved neuromuscular control, strength, range of motion, and functional ability resulting in improved performance and form.  PATIENT EDUCATION:  Education details: Exercise purpose/form. Self management techniques. Education on HEP including handout. Person educated: Patient Education method: Medical illustrator Education comprehension: verbalized understanding and needs further education  HOME EXERCISE PROGRAM: Access Code: [email or web address] URL: [email or web address] Date: 04/21/2023 Prepared by: Norton Blizzard  Exercises - Standing Single Leg Stance with Unilateral Counter Support  - 3-5 x weekly - 2 reps - 30-60 seconds hold - Tandem Stance with Eyes Closed in Corner  - 3-5 x weekly - 2 sets - 30-60 hold - Tandem Stance with Head Rotation  - 3-5 x weekly - 2 sets - 20 reps  ASSESSMENT:  CLINICAL IMPRESSION: Patient arrives late today after missing his last two appointments. He also demonstrated difficulty reading/interpreting appointment calendar and phone functions which is likely related to his known dementia diagnosis and negatively impacting his care. He was educated on the role with cognitive strategies a SLP could play in his care, but was not ready to take action. He continued to perform very well in basic balance activities, but demonstrated some deficits in more challenging tasks such as single leg stance and PF/DF on bosu ball. Patient was very pleasant and enthusiastic about participating in PT and appeared to understand HEP well with the ability to perform it safely. Plan to incorporate more complex dynamic balance activities next session including uneven surfaces as well as dual tasking. Patient would benefit from continued management of limiting condition by  skilled physical therapist to address remaining impairments and functional limitations to work towards stated goals and return to PLOF or maximal functional independence.   From initial PT evaluation on 04/11/2023:  Patient is a 78 y.o. male referred to outpatient physical therapy with a medical diagnosis of unsteady gait when walking, at moderate risk for fall who presents with signs and symptoms consistent with mild unsteadiness on feet with history of 2 falls with mild injury in the last 6 months. MiniBEST assessment shows he has limitations with backwards reactive stepping, maintaining balance on an incline, single leg stance, and dual tasking while performing dynamic walking activity (TUG). Although patient does not show severe deficits, he would benefit from education and exercises to help improve these impairments as well as possible further exam for the mild knee R pain he reports from one of his falls. Patient will benefit from skilled physical therapy intervention to address current body structure impairments and activity limitations to improve function and work towards goals set in current POC in order to return to prior level of function or maximal functional improvement.    OBJECTIVE IMPAIRMENTS: decreased balance, decreased cognition, decreased coordination, decreased knowledge of condition, decreased mobility, and pain.   ACTIVITY LIMITATIONS: carrying, locomotion level, and walking/jogging with his 2 dogs, walking on uneven or icy ground  PARTICIPATION LIMITATIONS: interpersonal relationship, community activity, and walking/jogging with his 2 dogs, caring for his dogs  PERSONAL FACTORS: Age, Past/current experiences, and 3+ comorbidities:   Atherosclerosis of abdominal aorta (HCC); Enlarged heart; Malignant neoplasm of prostate (HCC); Paroxysmal A-fib (HCC); memory impairments, and Personal history of other malignant neoplasm of skin are also affecting patient's functional outcome.    REHAB POTENTIAL: Good  CLINICAL DECISION MAKING: Stable/uncomplicated  EVALUATION COMPLEXITY: Low   GOALS: Goals reviewed with patient? No  SHORT TERM GOALS: Target date: 04/25/2023  Patient will be independent with initial home exercise program for self-management of symptoms. Baseline: Initial HEP to be provided at visit 2 as appropriate (04/11/23); provided with initial HEP (04/21/2023);  Goal status: In progress   LONG TERM GOALS: Target date: 07/04/2023  Patient will be independent with a long-term home exercise program for self-management of symptoms.  Baseline: Initial HEP to be provided at visit 2 as appropriate (04/11/23); provided with initial HEP (04/21/2023);  Goal status: In progress  2.  Patient will demonstrate improved Activities-Specific Balance Confidence (ABC) scale to equal or greater than 99% to demonstrate improvement in overall condition and self-reported functional ability.  Baseline: 96.25 (04/11/23); Goal status: In progress  3.  Patient will improve his MiniBEST score to equal or greater than 27/28 to demonstrate improvements in his areas of deficits (single leg stance, backwards reactive balance, eyes closed on slant board, and dual tasking) which will decrease his fall risk.  Baseline: 25/28 (04/11/23); Goal status: In progress  4.  Patient will demonstrate improvement in Patient Specific Functional Scale (PSFS) of equal or greater than 8/10 points to reflect clinically significant improvement in patient's most valued functional activities. Baseline: to be measured at visit 2 as appropriate (04/11/23); 7/10 at visit #2 (04/21/2023);  Goal status: In progress    PLAN:  PT FREQUENCY: 1-2x/week  PT DURATION: 6-12 weeks  PLANNED INTERVENTIONS: 97164- PT Re-evaluation, 97110-Therapeutic exercises, 97530- Therapeutic activity, 97112- Neuromuscular re-education, 97535- Self Care, 16109- Manual therapy, [phone]- Gait training, [phone]- Electrical stimulation  (unattended), Patient/Family education, Balance training, Dry Needling, Joint mobilization, Spinal mobilization, Cryotherapy, and Moist heat.  PLAN FOR NEXT SESSION: start balance exercises targetting identified weakness on MiniBEST, strength testing.    Luretha Murphy. Ilsa Iha, PT, DPT 04/21/23, 8:17 PM  [facility] [address] [place], [place] [address] P: [phone] I F: [phone]

## 2023-04-26 ENCOUNTER — Encounter: Payer: Self-pay | Admitting: Physical Therapy

## 2023-04-26 ENCOUNTER — Ambulatory Visit: Admitting: Physical Therapy

## 2023-04-26 DIAGNOSIS — R2681 Unsteadiness on feet: Secondary | ICD-10-CM

## 2023-04-26 DIAGNOSIS — Z9181 History of falling: Secondary | ICD-10-CM

## 2023-04-26 NOTE — Therapy (Signed)
 OUTPATIENT PHYSICAL THERAPY TREATMENT   Patient Name: Bruce Estrada. 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   Past Surgical History:  Procedure Laterality Date   COLONOSCOPY WITH PROPOFOL N/A 10/21/2015   Procedure: COLONOSCOPY WITH PROPOFOL;  Surgeon: Earline Mayotte, MD;  Location: [facility] END[facility]COPY;  Service: Endoscopy;  Laterality: N/A;   COLONOSCOPY WITH PROPOFOL N/A 11/19/2020   Procedure: COLONOSCOPY WITH PROPOFOL;  Surgeon: Earline Mayotte, MD;  Location: [facility];  Service: Endoscopy;  Laterality: N/A;   Patient Active Problem List   Diagnosis Date Noted   Malignant neoplasm of prostate (HCC) 02/11/2022   Atherosclerosis of abdominal aorta (HCC) 10/27/2020   Enlarged heart 10/27/2020   Paroxysmal A-fib (HCC) 10/27/2020   Personal history of other malignant neoplasm of skin 07/10/2019   Encounter for screening colonoscopy 10/06/2015    PCP: Katherina Right C. Arlana Pouch, MD  REFERRING PROVIDER: Elinor Parkinson, DPM  REFERRING DIAG: unsteady gait when walking, at moderate risk for fall  Rationale for Evaluation and Treatment: Rehabilitation  THERAPY DIAG:  Unsteadiness on feet  History of falling  ONSET DATE: 03/30/2023  SUBJECTIVE:                                                                                                                                                                                            PERTINENT HISTORY:  Patient is a 78 y.o. male who presents to outpatient physical therapy with a referral for medical diagnosis unsteady gait when walking, at moderate risk for fall. This patient's chief complaints consist of occasional falls and unsteadiness on feet with 2 falls in last 6 months, mild low back pain, and left anterior knee pain (from one of the falls) leading to the following functional deficits: is extra careful with how he moves so he does not fall, limited footwear. Relevant past medical history and comorbidities include has Atherosclerosis of abdominal  aorta (HCC); Enlarged heart; Malignant neoplasm of prostate (HCC); Paroxysmal A-fib (HCC); and Personal history of other malignant neoplasm of skin. Patient denies hx of stroke, seizures, lung problems, diabetes, unexplained weight loss, unexplained changes in bowel or bladder problems, unexplained stumbling or dropping things, osteoporosis, and spinal surgery  He is being treated for prostate cancer (finishing final stages of treatment after being treated for 10 years).   Exercise history: currently 2 miles (minimum)/day with 2 huskies every day, stretches in the morning or anytime he can to get his joints moving.   Lives alone since his wife had a stroke about 9 months ago and she has not been able to return home since she needs 24/7 care (provided by his daughter at her home).   He has some concerns about the anterior left knee that pops when he moves it after his fall  SUBJECTIVE STATEMENT: Patient states he is feeling okay today. He did not do all of his HEP because he felt sick after putting too much spice in his dinner. He reports no falls or near falls.   PAIN: NPRS: 0/10  PRECAUTIONS: None  FALLS:  Has patient fallen in last 6 months? Yes. Number of falls 2 once with his huskies when he stepped on uneven ground when they chased some loose dogs and once when he took his huskies to the vet and he was walking them to go  to the bathroom and they chased a dog while he was standing on ice   PATIENT GOALS: unsure what to expect. Would like to know if there is something in his gait or walking that is causing his low back pain. Wants to know if there is something he can change to help.    OBJECTIVE  TREATMENT                                                                                                                             Neuromuscular Re-education: a technique or exercise performed with the goal of improving the level of communication between the body and the brain, such as for balance, motor control, muscle activation patterns, coordination, desensitization, quality of muscle contraction, proprioception, and/or kinesthetic sense needed for successful and safe completion of functional activities.   Circuit:  Standing ankle PF/DF on flat side of BOSU 3x20 with occasional balance checks with UE support on TM  SBA for safety  Standing lateral LE pumps on  flat side of BOSU 3x20 with occasional balance checks with UE support on TM  SBA for safety  Reverse lunge over airex pad with UE available on each side 1x10 each side Occasional balance checks with UE support Occasional tripping over pad SBA-supervision Progress to looking up more and less use of hands on legs next session Improved with practice Educated on potential DOMS  Step up onto and over stacked airex+a2z pad, forwards and backwards leading with the same foot (requiring switching feet at top of stack).  3x10 each foot each direction U UE support available and used for occasional balance checks  CGA- SBA Needed step by step cuing for foot sequencing at first Added trying to keep eyes ahead/up at end of 1st set Added dual tasking for last 2 sets with patient naming objects or words in certain categories.   Pt required multimodal cuing for proper technique and to facilitate improved neuromuscular control, strength, range of motion, and  functional ability resulting in improved performance and form.  PATIENT EDUCATION:  Education details: Exercise purpose/form. Self management techniques. Education on HEP including handout. Person educated: Patient Education method: Medical illustrator Education comprehension: verbalized understanding and needs further education  HOME EXERCISE PROGRAM: Access Code: [email or web address] URL: [email or web address] Date: 04/21/2023 Prepared by: Norton Blizzard  Exercises - Standing Single Leg Stance with Unilateral Counter Support  - 3-5 x weekly - 2 reps - 30-60 seconds hold - Tandem Stance with Eyes Closed in Corner  - 3-5 x weekly - 2 sets - 30-60 hold - Tandem Stance with Head Rotation  - 3-5 x weekly - 2 sets - 20 reps  ASSESSMENT:  CLINICAL IMPRESSION: He arrived on time today, but then he went back out to his vehicle for several minutes, resulting in starting PT late. Today's session focused on dynamic balance and dual tasking. Patient was appropriately challenged and showed within session improvement, but was unable to complete stepping activity continuously while also thinking of words. He demonstrated good frustration tolerance and put forth good effort. He appears to be agreeable with feeling challenged. Patient would benefit from continued management of limiting condition by skilled physical therapist to address remaining impairments and functional limitations to work towards stated goals and return to PLOF or maximal functional independence.   From initial PT evaluation on 04/11/2023:  Patient is a 78 y.o. male referred to outpatient physical therapy with a medical diagnosis of unsteady gait when walking, at moderate risk for fall who presents with signs and symptoms consistent with mild unsteadiness on feet with history of 2 falls with mild injury in the last 6 months. MiniBEST assessment shows he has limitations with backwards reactive stepping, maintaining balance on an  incline, single leg stance, and dual tasking while performing dynamic walking activity (TUG). Although patient does not show severe deficits, he would benefit from education and exercises to help improve these impairments as well as possible further exam for the mild knee R pain he reports from one of his falls. Patient will benefit from skilled physical therapy intervention to address current body structure impairments and activity limitations to improve function and work towards goals set in current POC in order to return to prior level of function or maximal functional improvement.    OBJECTIVE IMPAIRMENTS: decreased balance, decreased cognition, decreased coordination, decreased knowledge of condition, decreased mobility, and pain.   ACTIVITY LIMITATIONS: carrying, locomotion level, and walking/jogging with his 2 dogs, walking on uneven or icy ground  PARTICIPATION LIMITATIONS: interpersonal relationship, community activity, and walking/jogging with his 2 dogs, caring for his dogs  PERSONAL FACTORS: Age, Past/current experiences, and 3+ comorbidities:   Atherosclerosis of abdominal aorta (HCC); Enlarged heart; Malignant neoplasm of prostate (HCC); Paroxysmal A-fib (HCC); memory impairments, and Personal history of other malignant neoplasm of skin are also affecting patient's functional outcome.   REHAB POTENTIAL: Good  CLINICAL DECISION MAKING: Stable/uncomplicated  EVALUATION COMPLEXITY: Low   GOALS: Goals reviewed with patient? No  SHORT TERM GOALS: Target date: 04/25/2023  Patient will be independent with initial home  exercise program for self-management of symptoms. Baseline: Initial HEP to be provided at visit 2 as appropriate (04/11/23); provided with initial HEP (04/21/2023);  Goal status: In progress   LONG TERM GOALS: Target date: 07/04/2023  Patient will be independent with a long-term home exercise program for self-management of symptoms.  Baseline: Initial HEP to be provided  at visit 2 as appropriate (04/11/23); provided with initial HEP (04/21/2023);  Goal status: In progress  2.  Patient will demonstrate improved Activities-Specific Balance Confidence (ABC) scale to equal or greater than 99% to demonstrate improvement in overall condition and self-reported functional ability.  Baseline: 96.25 (04/11/23); Goal status: In progress  3.  Patient will improve his MiniBEST score to equal or greater than 27/28 to demonstrate improvements in his areas of deficits (single leg stance, backwards reactive balance, eyes closed on slant board, and dual tasking) which will decrease his fall risk.  Baseline: 25/28 (04/11/23); Goal status: In progress  4.  Patient will demonstrate improvement in Patient Specific Functional Scale (PSFS) of equal or greater than 8/10 points to reflect clinically significant improvement in patient's most valued functional activities. Baseline: to be measured at visit 2 as appropriate (04/11/23); 7/10 at visit #2 (04/21/2023);  Goal status: In progress    PLAN:  PT FREQUENCY: 1-2x/week  PT DURATION: 6-12 weeks  PLANNED INTERVENTIONS: 97164- PT Re-evaluation, 97110-Therapeutic exercises, 97530- Therapeutic activity, 97112- Neuromuscular re-education, 97535- Self Care, 16109- Manual therapy, [phone]- Gait training, [phone]- Electrical stimulation (unattended), Patient/Family education, Balance training, Dry Needling, Joint mobilization, Spinal mobilization, Cryotherapy, and Moist heat.  PLAN FOR NEXT SESSION: start balance exercises targetting identified weakness on MiniBEST, strength testing.    Luretha Murphy. Ilsa Iha, PT, DPT 04/26/23, 10:48 AM  [facility] [address] [place], [place] [address] P: [phone] I F: [phone]

## 2023-04-28 ENCOUNTER — Encounter: Payer: Self-pay | Admitting: Physical Therapy

## 2023-04-28 ENCOUNTER — Ambulatory Visit: Admitting: Physical Therapy

## 2023-04-28 DIAGNOSIS — R2681 Unsteadiness on feet: Secondary | ICD-10-CM | POA: Diagnosis not present

## 2023-04-28 DIAGNOSIS — Z9181 History of falling: Secondary | ICD-10-CM

## 2023-04-28 NOTE — Therapy (Signed)
 OUTPATIENT PHYSICAL THERAPY TREATMENT   Patient Name: Bruce Estrada. 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   Past Surgical History:  Procedure Laterality Date   COLONOSCOPY WITH PROPOFOL N/A 10/21/2015   Procedure: COLONOSCOPY WITH PROPOFOL;  Surgeon: Earline Mayotte, MD;  Location: [facility] ENDOSCOPY;  Service: Endoscopy;  Laterality: N/A;   COLONOSCOPY WITH PROPOFOL N/A 11/19/2020   Procedure: COLONOSCOPY WITH PROPOFOL;  Surgeon: Earline Mayotte, MD;  Location: [facility] ENDOSCOPY;  Service: Endoscopy;  Laterality: N/A;   Patient Active Problem List   Diagnosis Date Noted   Malignant neoplasm of prostate (HCC) 02/11/2022   Atherosclerosis of abdominal aorta (HCC) 10/27/2020   Enlarged heart 10/27/2020   Paroxysmal A-fib (HCC) 10/27/2020   Personal history of other malignant neoplasm of skin 07/10/2019   Encounter for screening colonoscopy 10/06/2015    PCP: Katherina Right C. Arlana Pouch, MD  REFERRING PROVIDER: Elinor Parkinson, DPM  REFERRING DIAG: unsteady gait when walking, at moderate risk for fall  Rationale for Evaluation and Treatment: Rehabilitation  THERAPY DIAG:  Unsteadiness on feet  History of falling  ONSET DATE: 03/30/2023  SUBJECTIVE:                                                                                                                                                                                            PERTINENT HISTORY:  Patient is a 78 y.o. male who presents to outpatient physical therapy with a referral for medical diagnosis unsteady gait when walking, at moderate risk for fall. This patient's chief complaints consist of occasional falls and unsteadiness on feet with 2 falls in last 6 months, mild low back pain, and left anterior knee pain (from one of the falls) leading to the following functional deficits: is extra careful with how he moves so he does not fall, limited footwear. Relevant past medical history and comorbidities include has Atherosclerosis of  abdominal aorta (HCC); Enlarged heart; Malignant neoplasm of prostate (HCC); Paroxysmal A-fib (HCC); and Personal history of other malignant neoplasm of skin. Patient denies hx of stroke, seizures, lung problems, diabetes, unexplained weight loss, unexplained changes in bowel or bladder problems, unexplained stumbling or dropping things, osteoporosis, and spinal surgery  He is being treated for prostate cancer (finishing final stages of treatment after being treated for 10 years).   Exercise history: currently 2 miles (minimum)/day with 2 huskies every day, stretches in the morning or anytime he can to get his joints moving.   Lives alone since his wife had a stroke about 9 months ago and she has not been able to return home since she needs 24/7 care (provided by his daughter at her home).   He has some concerns about the anterior left knee that pops when he moves it after his fall  SUBJECTIVE STATEMENT: Patient reports no pain. He continues to walk with his huskies. No falls. He completed his HEP x2 since last PT session.   PAIN: NPRS: 0/10  PRECAUTIONS: None  FALLS:  Has patient fallen in last 6 months? Yes. Number of falls 2 once with his huskies when he stepped on uneven ground when they chased some loose dogs and once when he took his huskies to the vet and he was walking them to go to the bathroom and they chased a dog while he  was standing on ice   PATIENT GOALS: unsure what to expect. Would like to know if there is something in his gait or walking that is causing his low back pain. Wants to know if there is something he can change to help.    OBJECTIVE  TREATMENT                                                                                                                             Neuromuscular Re-education: a technique or exercise performed with the goal of improving the level of communication between the body and the brain, such as for balance, motor control, muscle activation patterns, coordination, desensitization, quality of muscle contraction, proprioception, and/or kinesthetic sense needed for successful and safe completion of functional activities.   Circuit:  Standing ankle PF/DF on flat side of BOSU 3x20 with occasional balance checks with UE support on TM  SBA for safety  Standing lateral LE pumps on  flat side of BOSU 3x20 with occasional balance checks with UE support on TM  SBA for safety  Step up onto and over stacked airex+a2z pad, forwards and backwards leading with the same foot (requiring switching feet at top of stack).  3x10 each foot each direction U UE support available and used for occasional balance checks  CGA- SBA Needed step by step cuing for foot sequencing at times Cuing to keep eyes ahead/up at end of 1st set dual tasking throughout with patient naming various things each time he crossed the pads  Hurdle stepping matrix (forwards, diagnoal  forwards, diagonal backwards, and backwards), stepping over hurdle with one foot, then pushing off of that foot to bring it back to the other foot in the middle of the arranged hurdles.  1x10 times around each direction CGA-minA  Pt required multimodal cuing for proper technique and to facilitate improved neuromuscular control, strength, range of motion, and functional ability resulting in improved performance and form.  PATIENT  EDUCATION:  Education details: Exercise purpose/form. Self management techniques. Education on HEP including handout. Person educated: Patient Education method: Medical illustrator Education comprehension: verbalized understanding and needs further education  HOME EXERCISE PROGRAM: Access Code: [email or web address] URL: [email or web address] Date: 04/21/2023 Prepared by: Norton Blizzard  Exercises - Standing Single Leg Stance with Unilateral Counter Support  - 3-5 x weekly - 2 reps - 30-60 seconds hold - Tandem Stance with Eyes Closed in Corner  - 3-5 x weekly - 2 sets - 30-60 hold - Tandem Stance with Head Rotation  - 3-5 x weekly - 2 sets - 20 reps  ASSESSMENT:  CLINICAL IMPRESSION: Patient early to appointment today. Continues to work on dynamic balance including on unstable surface and with dual tasking which is challenging for him. He reported feeling well by end of session and gave excellent effort. He required cuing for sequencing and to remember task and guarding up to min A to prevent falls. Patient would benefit from continued management of limiting condition by skilled physical therapist to address remaining impairments and functional limitations to work towards stated goals and return to PLOF or maximal functional independence.    From initial PT evaluation on 04/11/2023:  Patient is a 78 y.o. male referred to outpatient physical therapy with a medical diagnosis of unsteady gait when walking, at moderate risk for fall who presents with signs and symptoms consistent with mild unsteadiness on feet with history of 2 falls with mild injury in the last 6 months. MiniBEST assessment shows he has limitations with backwards reactive stepping, maintaining balance on an incline, single leg stance, and dual tasking while performing dynamic walking activity (TUG). Although patient does not show severe deficits, he would benefit from education and exercises to help improve these  impairments as well as possible further exam for the mild knee R pain he reports from one of his falls. Patient will benefit from skilled physical therapy intervention to address current body structure impairments and activity limitations to improve function and work towards goals set in current POC in order to return to prior level of function or maximal functional improvement.    OBJECTIVE IMPAIRMENTS: decreased balance, decreased cognition, decreased coordination, decreased knowledge of condition, decreased mobility, and pain.   ACTIVITY LIMITATIONS: carrying, locomotion level, and walking/jogging with his 2 dogs, walking on uneven or icy ground  PARTICIPATION LIMITATIONS: interpersonal relationship, community activity, and walking/jogging with his 2 dogs, caring for his dogs  PERSONAL FACTORS: Age, Past/current experiences, and 3+ comorbidities:   Atherosclerosis of abdominal aorta (HCC); Enlarged heart; Malignant neoplasm of prostate (HCC); Paroxysmal A-fib (HCC); memory impairments, and Personal history of other malignant neoplasm of skin are also affecting patient's functional outcome.   REHAB POTENTIAL: Good  CLINICAL DECISION MAKING: Stable/uncomplicated  EVALUATION COMPLEXITY: Low   GOALS: Goals reviewed with patient? No  SHORT TERM GOALS: Target date: 04/25/2023  Patient will be independent with initial home exercise program for self-management of symptoms. Baseline: Initial HEP to be provided at visit 2 as appropriate (04/11/23); provided with initial HEP (04/21/2023);  Goal status: In progress   LONG TERM GOALS:  Target date: 07/04/2023  Patient will be independent with a long-term home exercise program for self-management of symptoms.  Baseline: Initial HEP to be provided at visit 2 as appropriate (04/11/23); provided with initial HEP (04/21/2023);  Goal status: In progress  2.  Patient will demonstrate improved Activities-Specific Balance Confidence (ABC) scale to equal or  greater than 99% to demonstrate improvement in overall condition and self-reported functional ability.  Baseline: 96.25 (04/11/23); Goal status: In progress  3.  Patient will improve his MiniBEST score to equal or greater than 27/28 to demonstrate improvements in his areas of deficits (single leg stance, backwards reactive balance, eyes closed on slant board, and dual tasking) which will decrease his fall risk.  Baseline: 25/28 (04/11/23); Goal status: In progress  4.  Patient will demonstrate improvement in Patient Specific Functional Scale (PSFS) of equal or greater than 8/10 points to reflect clinically significant improvement in patient's most valued functional activities. Baseline: to be measured at visit 2 as appropriate (04/11/23); 7/10 at visit #2 (04/21/2023);  Goal status: In progress    PLAN:  PT FREQUENCY: 1-2x/week  PT DURATION: 6-12 weeks  PLANNED INTERVENTIONS: 97164- PT Re-evaluation, 97110-Therapeutic exercises, 97530- Therapeutic activity, 97112- Neuromuscular re-education, 97535- Self Care, 62952- Manual therapy, [phone]- Gait training, [phone]- Electrical stimulation (unattended), Patient/Family education, Balance training, Dry Needling, Joint mobilization, Spinal mobilization, Cryotherapy, and Moist heat.  PLAN FOR NEXT SESSION: continue balance exercises targetting identified weakness on MiniBEST, strength testing.    Luretha Murphy. Ilsa Iha, PT, DPT 04/28/23, 9:48 AM  [facility] [address] [place], [place] [address] P: [phone] I F: [phone]

## 2023-05-03 ENCOUNTER — Telehealth: Payer: Self-pay | Admitting: Physical Therapy

## 2023-05-03 ENCOUNTER — Encounter: Payer: Self-pay | Admitting: Physical Therapy

## 2023-05-03 ENCOUNTER — Ambulatory Visit: Admitting: Physical Therapy

## 2023-05-03 NOTE — Telephone Encounter (Signed)
 LVM notifying patient of missed PT visit scheduled at 9:45am today. Requested they call back to reschedule, confirm next appointment, or let us  know of any changes in PT plans. Let patient know that with any no-show I am required to review our cancellation policy that after 2 no-shows we cannot schedule more than 1 week at a time and/or we may remove a patient from the schedule.   Bruce Estrada. Artemio Larry, PT, DPT 05/03/23, 10:00 AM  [facility] [address] [place], [place] [address] P: [phone] I F: [phone]

## 2023-05-05 ENCOUNTER — Other Ambulatory Visit: Payer: Self-pay

## 2023-05-05 ENCOUNTER — Emergency Department
Admission: EM | Admit: 2023-05-05 | Discharge: 2023-05-05 | Disposition: A | Discharge status: Home / Self Care | Attending: Emergency Medicine | Admitting: Emergency Medicine

## 2023-05-05 ENCOUNTER — Emergency Department

## 2023-05-05 DIAGNOSIS — Z7901 Long term (current) use of anticoagulants: Secondary | ICD-10-CM | POA: Diagnosis not present

## 2023-05-05 DIAGNOSIS — I480 Paroxysmal atrial fibrillation: Secondary | ICD-10-CM | POA: Diagnosis not present

## 2023-05-05 DIAGNOSIS — Z8546 Personal history of malignant neoplasm of prostate: Secondary | ICD-10-CM | POA: Insufficient documentation

## 2023-05-05 DIAGNOSIS — S0031XA Abrasion of nose, initial encounter: Secondary | ICD-10-CM | POA: Diagnosis not present

## 2023-05-05 DIAGNOSIS — Y93K1 Activity, walking an animal: Secondary | ICD-10-CM | POA: Insufficient documentation

## 2023-05-05 DIAGNOSIS — W01198A Fall on same level from slipping, tripping and stumbling with subsequent striking against other object, initial encounter: Secondary | ICD-10-CM | POA: Diagnosis not present

## 2023-05-05 DIAGNOSIS — S0083XA Contusion of other part of head, initial encounter: Secondary | ICD-10-CM | POA: Insufficient documentation

## 2023-05-05 DIAGNOSIS — S0990XA Unspecified injury of head, initial encounter: Secondary | ICD-10-CM | POA: Diagnosis present

## 2023-05-05 DIAGNOSIS — Z23 Encounter for immunization: Secondary | ICD-10-CM | POA: Diagnosis not present

## 2023-05-05 MED ORDER — TETANUS-DIPHTH-ACELL PERTUSSIS 5-2.5-18.5 LF-MCG/0.5 IM SUSY
0.5000 mL | PREFILLED_SYRINGE | Freq: Once | INTRAMUSCULAR | Status: AC
  Administered 2023-05-05: 0.5 mL via INTRAMUSCULAR
  Filled 2023-05-05: qty 0.5

## 2023-05-05 NOTE — ED Provider Notes (Signed)
 [facility] Provider Note    Event Date/Time   First MD Initiated Contact with Patient 05/05/23 1853     (approximate)   History   Chief Complaint: Fall   HPI  Bruce Estrada. is a 78 y.o. male with a past history of prostate cancer, paroxysmal atrial fibrillation on Eliquis who comes to the ED due to blunt head trauma.  Patient reports being in his usual state of health when he was walking his 2 dogs, tripped over the edge of the sidewalk and fell to his knees, at which point his dogs pulled on the leash, causing him to fall from his knees all the way to the ground, hitting his head on the concrete.  No loss of consciousness, no neck pain.  Feels in his usual state of health.  He went home, cleaned up his wounds, and [facility], at which point he was sent to the ED.  Unsure when his last tetanus vaccine was        Past Medical History:  Diagnosis Date   Cancer [facility]) 2015   prostate seed    Current Outpatient Rx   Order #: [id number] Class: Historical Med   Order #: [id number] Class: Historical Med   Order #: [id number] Class: Historical Med   Order #: [id number] Class: Historical Med   Order #: [id number] Class: Historical Med   Order #: [id number] Class: Historical Med   Order #: [id number] Class: Historical Med   Order #: [id number] Class: Historical Med   Order #: [id number] Class: Historical Med   Order #: [id number] Class: Historical Med   Order #: [id number] Class: Historical Med   Order #: [id number] Class: Historical Med    Past Surgical History:  Procedure Laterality Date   COLONOSCOPY WITH PROPOFOL N/A 10/21/2015   Procedure: COLONOSCOPY WITH PROPOFOL;  Surgeon: Earline Mayotte, MD;  Location: [facility] ENDOSCOPY;  Service: Endoscopy;  Laterality: N/A;   COLONOSCOPY WITH PROPOFOL N/A 11/19/2020   Procedure: COLONOSCOPY WITH PROPOFOL;  Surgeon: Earline Mayotte, MD;  Location: [facility] ENDOSCOPY;  Service: Endoscopy;   Laterality: N/A;    Physical Exam   Triage Vital Signs: ED Triage Vitals  Encounter Vitals Group     BP 05/05/23 1819 (!) 157/100     Systolic BP Percentile --      Diastolic BP Percentile --      Pulse Rate 05/05/23 1819 86     Resp 05/05/23 1819 18     Temp 05/05/23 1819 98.1 F (36.7 C)     Temp Source 05/05/23 1819 Oral     SpO2 05/05/23 1819 100 %     Weight 05/05/23 1820 155 lb (70.3 kg)     Height 05/05/23 1820 5\' 7"  (1.702 m)     Head Circumference --      Peak Flow --      Pain Score 05/05/23 1820 0     Pain Loc --      Pain Education --      Exclude from Growth Chart --     Most recent vital signs: Vitals:   05/05/23 1819 05/05/23 2046  BP: (!) 157/100 135/82  Pulse: 86 85  Resp: 18 16  Temp: 98.1 F (36.7 C)   SpO2: 100% 100%    General: Awake, no distress.  CV:  Good peripheral perfusion.  Regular rate Resp:  Normal effort.  Abd:  No distention.  Other:  Ambulatory.  No deformities or lacerations.  Long bones stable.  Abrasion over the  nose and left forehead   ED Results / Procedures / Treatments   Labs (all labs ordered are listed, but only abnormal results are displayed) Labs Reviewed - No data to display   EKG    RADIOLOGY CT head negative   PROCEDURES:  Procedures   MEDICATIONS ORDERED IN ED: Medications  Tdap (BOOSTRIX) injection 0.5 mL (0.5 mLs Intramuscular Given 05/05/23 2015)     IMPRESSION / MDM / ASSESSMENT AND PLAN / ED COURSE  I reviewed the triage vital signs and the nursing notes.  DDx: Intracranial hemorrhage, facial abrasion  Patient's presentation is most consistent with acute presentation with potential threat to life or bodily function.     Clinical Course as of 05/05/23 2320  Thu May 05, 2023  2032 CT head unremarkable.  Tetanus updated.  Stable for discharge  [PS]    Clinical Course User Index [PS] Jacquie Maudlin, MD     FINAL CLINICAL IMPRESSION(S) / ED DIAGNOSES   Final diagnoses:   Contusion of face, initial encounter     Rx / DC Orders   ED Discharge Orders     None        Note:  This document was prepared using Dragon voice recognition software and may include unintentional dictation errors.   Jacquie Maudlin, MD 05/05/23 [phone]

## 2023-05-05 NOTE — ED Triage Notes (Signed)
 Pt presents to the ED pov. Sent over from [facility] clinic for imaging. Pt tripped and fell on the sidewalk earlier today. (Before noon) States that his knee hit the ground and then his face. Denies LOC. Does take blood thinner. Pt denies dizziness or pain. Does have abrasions to face.

## 2023-05-05 NOTE — Discharge Instructions (Addendum)
 Your CT scan of the head today is normal.  You received a tetanus vaccine today.  Please continue taking all of your medications and follow-up with your doctor.

## 2023-05-05 NOTE — ED Notes (Signed)
 See triage note  Presents s/p fall  States he tripped and fell on sidewalk  Hit knee  and his face  Abrasions to face

## 2023-05-05 NOTE — ED Triage Notes (Signed)
 First Nurse Note: Patient to ED from [facility] for a fall. Patient on eliquis- denies LOC. Also chipped tooth during fall. Abrasions noted to face. Sent over for imaging.

## 2023-05-09 ENCOUNTER — Encounter: Payer: Self-pay | Admitting: Physical Therapy

## 2023-05-09 ENCOUNTER — Ambulatory Visit: Admitting: Physical Therapy

## 2023-05-11 ENCOUNTER — Ambulatory Visit: Admitting: Physical Therapy

## 2023-05-11 ENCOUNTER — Encounter: Payer: Self-pay | Admitting: Physical Therapy

## 2023-05-16 ENCOUNTER — Ambulatory Visit: Admitting: Physical Therapy

## 2023-05-16 ENCOUNTER — Encounter: Payer: Self-pay | Admitting: Physical Therapy

## 2023-05-18 ENCOUNTER — Encounter: Payer: Self-pay | Admitting: Physical Therapy

## 2023-05-18 ENCOUNTER — Encounter: Admitting: Physical Therapy

## 2023-05-23 ENCOUNTER — Encounter: Admitting: Physical Therapy

## 2023-05-25 ENCOUNTER — Encounter: Admitting: Physical Therapy

## 2023-05-30 ENCOUNTER — Encounter: Admitting: Physical Therapy

## 2023-06-01 ENCOUNTER — Encounter: Admitting: Physical Therapy

## 2023-09-30 ENCOUNTER — Ambulatory Visit: Admission: EM | Admit: 2023-09-30 | Discharge: 2023-09-30 | Disposition: A | Discharge status: Home / Self Care

## 2023-09-30 ENCOUNTER — Encounter: Payer: Self-pay | Admitting: Emergency Medicine

## 2023-09-30 DIAGNOSIS — Z4801 Encounter for change or removal of surgical wound dressing: Secondary | ICD-10-CM

## 2023-09-30 NOTE — ED Triage Notes (Signed)
 Patient states that he had MOHS surgery done to his nose at [facility] yesterday.  Patient states that he is unable to do his dressing change.  Patient states that he brought his home care packet with him.

## 2023-09-30 NOTE — ED Provider Notes (Signed)
 [facility]    CSN: [id number] Arrival date & time: 09/30/23  1704      History   Chief Complaint Chief Complaint  Patient presents with   Dressing Change    HPI Bruce Estrada. is a 78 y.o. male presenting for dressing change. Patient had MOHS surgery yesterday and says he cannot change the dressing himself. He has brought the instructions with him as well as some supplies. Denies any significant pain. Has not bled through the dressing. Feeling well overall.   HPI  Past Medical History:  Diagnosis Date   Cancer [facility]) 2015   prostate seed    Patient Active Problem List   Diagnosis Date Noted   Malignant neoplasm of prostate (HCC) 02/11/2022   Atherosclerosis of abdominal aorta (HCC) 10/27/2020   Enlarged heart 10/27/2020   Paroxysmal A-fib (HCC) 10/27/2020   Personal history of other malignant neoplasm of skin 07/10/2019   Encounter for screening colonoscopy 10/06/2015    Past Surgical History:  Procedure Laterality Date   COLONOSCOPY WITH PROPOFOL  N/A 10/21/2015   Procedure: COLONOSCOPY WITH PROPOFOL ;  Surgeon: Reyes LELON Cota, MD;  Location: [facility] ENDOSCOPY;  Service: Endoscopy;  Laterality: N/A;   COLONOSCOPY WITH PROPOFOL  N/A 11/19/2020   Procedure: COLONOSCOPY WITH PROPOFOL ;  Surgeon: Cota Reyes LELON, MD;  Location: [facility] ENDOSCOPY;  Service: Endoscopy;  Laterality: N/A;       Home Medications    Prior to Admission medications   Medication Sig Start Date End Date Taking? Authorizing Provider  apalutamide (ERLEADA) 60 MG tablet Take 240 mg by mouth daily. May be taken with or without food. Swallow tablets whole.    [provider]  apixaban (ELIQUIS) 5 MG TABS tablet Take 1 tablet by mouth 2 (two) times daily. 03/15/22   [provider]  Ascorbic Acid (VITAMIN C IMMUNE HEALTH PO) Take by mouth.    [provider]  cyclobenzaprine (FLEXERIL) 5 MG tablet Take 5 mg by mouth 3 (three) times daily as needed. 02/14/23    [provider]  donepezil (ARICEPT) 5 MG tablet Take by mouth. 12/06/22   [provider]  ergocalciferol (VITAMIN D2) 1.25 MG (50000 UT) capsule Take 50,000 Units by mouth once a week.    [provider]  Leuprolide Acetate, 6 Month, (LUPRON DEPOT, 28-MONTH, IM) Inject into the muscle.    [provider]  metoprolol tartrate (LOPRESSOR) 25 MG tablet Take 1 tablet by mouth 2 (two) times daily. 05/17/22 05/17/23  [provider]  Misc Natural Products (JOINT HEALTH) CAPS Take by mouth.    [provider]  Multiple Vitamins-Minerals (MULTIVITAMIN WITH MINERALS) tablet Take 1 tablet by mouth daily.    [provider]  NUBEQA 300 MG tablet Take 600 mg by mouth 2 (two) times daily.    [provider]  Relugolix (ORGOVYX) 120 MG TABS Take by mouth.    [provider]    Family History History reviewed. No pertinent family history.  Social History Social History   Tobacco Use   Smoking status: Never   Smokeless tobacco: Never  Vaping Use   Vaping status: Never Used  Substance Use Topics   Alcohol use: No   Drug use: No     Allergies   Latex   Review of Systems Review of Systems  Skin:  Positive for wound. Negative for color change.     Physical Exam Triage Vital Signs ED Triage Vitals  Encounter Vitals Group     BP  09/30/23 1717 (!) 149/90     Girls Systolic BP Percentile --      Girls Diastolic BP Percentile --      Boys Systolic BP Percentile --      Boys Diastolic BP Percentile --      Pulse Rate 09/30/23 1717 100     Resp 09/30/23 1717 15     Temp 09/30/23 1717 98.5 F (36.9 C)     Temp Source 09/30/23 1717 Oral     SpO2 09/30/23 1717 100 %     Weight 09/30/23 1716 154 lb 15.7 oz (70.3 kg)     Height 09/30/23 1716 5' 7 (1.702 m)     Head Circumference --      Peak Flow --      Pain Score 09/30/23 1716 0     Pain Loc --      Pain Education --      Exclude from Growth Chart --     No data found.  Updated Vital Signs BP (!) 149/90 (BP Location: Left Arm)   Pulse 100   Temp 98.5 F (36.9 C) (Oral)   Resp 15   Ht 5' 7 (1.702 m)   Wt 154 lb 15.7 oz (70.3 kg)   SpO2 100%   BMI 24.27 kg/m      Physical Exam Vitals and nursing note reviewed.  Constitutional:      General: He is not in acute distress.    Appearance: Normal appearance. He is well-developed. He is not ill-appearing.  HENT:     Head: Normocephalic and atraumatic.  Eyes:     General: No scleral icterus.    Conjunctiva/sclera: Conjunctivae normal.  Cardiovascular:     Rate and Rhythm: Normal rate.  Pulmonary:     Effort: Pulmonary effort is normal. No respiratory distress.  Musculoskeletal:     Cervical back: Neck supple.  Skin:    General: Skin is warm and dry.     Capillary Refill: Capillary refill takes less than 2 seconds.     Comments: See image included in chart. Patient has irregular surgical wound with sutures in place. Scant bleeding in some areas. Contusion around left eye. Swelling of nose. No significant tenderness. No erythema/pustular drainage noted.   Neurological:     General: No focal deficit present.     Mental Status: He is alert. Mental status is at baseline.     Motor: No weakness.     Gait: Gait normal.  Psychiatric:        Mood and Affect: Mood normal.        Behavior: Behavior normal.      UC Treatments / Results  Labs (all labs ordered are listed, but only abnormal results are displayed) Labs Reviewed - No data to display  EKG   Radiology No results found.  Procedures Procedures (including critical care time)  DRESSING CHANGE: Dressing removed. Cleansed wound with chlorhexadine and dried well. Applied bacitracin, non adherent pads, and covered with surgical cloth tape.  Medications Ordered in UC Medications - No data to display  Initial Impression / Assessment and Plan / UC Course  I have reviewed the triage vital signs and the nursing  notes.  Pertinent labs & imaging results that were available during my care of the patient were reviewed by me and considered in my medical decision making (see chart for details).   78 year old male presents for dressing change.  Patient had Mohs surgery yesterday and is unable to change his  own dressings.  He denies any significant pain.  See image included in chart of patient's nose.  See procedure note.  Patient has been advised by dermatology to change the dressing 3 times daily.  He says he may have to return to our office to have the dressings changed.  States he does not have anyone else to help him.  Reviewed to follow-up as needed and keep follow-up appointment with dermatology next week.   Final Clinical Impressions(s) / UC Diagnoses   Final diagnoses:  Dressing change or removal, surgical wound   Discharge Instructions   None    ED Prescriptions   None    PDMP not reviewed this encounter.   Arvis Jolan NOVAK, PA-C 09/30/23 1805
